# Patient Record
Sex: Male | Born: 1990
Health system: Southern US, Community
[De-identification: ages and names within clinical notes are randomized; demographics above are authoritative.]

## PROBLEM LIST (undated history)

## (undated) DIAGNOSIS — R569 Unspecified convulsions: Secondary | ICD-10-CM

## (undated) DIAGNOSIS — F419 Anxiety disorder, unspecified: Secondary | ICD-10-CM

## (undated) DIAGNOSIS — T7840XA Allergy, unspecified, initial encounter: Secondary | ICD-10-CM

## (undated) DIAGNOSIS — I82419 Acute embolism and thrombosis of unspecified femoral vein: Secondary | ICD-10-CM

## (undated) DIAGNOSIS — J45909 Unspecified asthma, uncomplicated: Secondary | ICD-10-CM

## (undated) HISTORY — DX: Allergy, unspecified, initial encounter: T78.40XA

## (undated) HISTORY — PX: MULTIPLE TOOTH EXTRACTIONS: SHX2053

## (undated) HISTORY — PX: OTHER SURGICAL HISTORY: SHX169

## (undated) HISTORY — DX: Unspecified asthma, uncomplicated: J45.909

---

## 2011-03-15 ENCOUNTER — Ambulatory Visit: Payer: Self-pay

## 2011-03-15 DIAGNOSIS — S41109A Unspecified open wound of unspecified upper arm, initial encounter: Secondary | ICD-10-CM

## 2011-03-18 ENCOUNTER — Ambulatory Visit: Payer: Self-pay

## 2011-03-18 DIAGNOSIS — S41109A Unspecified open wound of unspecified upper arm, initial encounter: Secondary | ICD-10-CM

## 2011-03-18 DIAGNOSIS — F410 Panic disorder [episodic paroxysmal anxiety] without agoraphobia: Secondary | ICD-10-CM

## 2011-05-01 ENCOUNTER — Encounter (HOSPITAL_BASED_OUTPATIENT_CLINIC_OR_DEPARTMENT_OTHER): Payer: Self-pay | Admitting: Emergency Medicine

## 2011-05-01 ENCOUNTER — Emergency Department (HOSPITAL_BASED_OUTPATIENT_CLINIC_OR_DEPARTMENT_OTHER)
Admission: EM | Admit: 2011-05-01 | Discharge: 2011-05-01 | Disposition: A | Discharge status: Home / Self Care | Payer: Self-pay | Attending: Emergency Medicine | Admitting: Emergency Medicine

## 2011-05-01 DIAGNOSIS — F411 Generalized anxiety disorder: Secondary | ICD-10-CM | POA: Insufficient documentation

## 2011-05-01 DIAGNOSIS — F419 Anxiety disorder, unspecified: Secondary | ICD-10-CM

## 2011-05-01 DIAGNOSIS — F1010 Alcohol abuse, uncomplicated: Secondary | ICD-10-CM | POA: Insufficient documentation

## 2011-05-01 DIAGNOSIS — F17200 Nicotine dependence, unspecified, uncomplicated: Secondary | ICD-10-CM | POA: Insufficient documentation

## 2011-05-01 DIAGNOSIS — R109 Unspecified abdominal pain: Secondary | ICD-10-CM | POA: Insufficient documentation

## 2011-05-01 DIAGNOSIS — K2970 Gastritis, unspecified, without bleeding: Secondary | ICD-10-CM | POA: Insufficient documentation

## 2011-05-01 HISTORY — DX: Anxiety disorder, unspecified: F41.9

## 2011-05-01 LAB — RAPID URINE DRUG SCREEN, HOSP PERFORMED
Benzodiazepines: POSITIVE — AB
Cocaine: NOT DETECTED

## 2011-05-01 LAB — COMPREHENSIVE METABOLIC PANEL
Albumin: 3.8 g/dL (ref 3.5–5.2)
BUN: 14 mg/dL (ref 6–23)
Creatinine, Ser: 0.9 mg/dL (ref 0.50–1.35)
Total Bilirubin: 0.4 mg/dL (ref 0.3–1.2)
Total Protein: 7.3 g/dL (ref 6.0–8.3)

## 2011-05-01 LAB — ETHANOL: Alcohol, Ethyl (B): <11 mg/dL (ref 0–11)

## 2011-05-01 LAB — DIFFERENTIAL
Basophils Relative: 0 % (ref 0–1)
Eosinophils Absolute: 0.6 10*3/uL (ref 0.0–0.7)
Monocytes Absolute: 1 10*3/uL (ref 0.1–1.0)
Monocytes Relative: 10 % (ref 3–12)

## 2011-05-01 LAB — CBC
HCT: 44.7 % (ref 39.0–52.0)
Hemoglobin: 15.7 g/dL (ref 13.0–17.0)
MCH: 29.1 pg (ref 26.0–34.0)
MCHC: 35.1 g/dL (ref 30.0–36.0)
MCV: 82.8 fL (ref 78.0–100.0)

## 2011-05-01 LAB — LIPASE, BLOOD: Lipase: 36 U/L (ref 11–59)

## 2011-05-01 MED ORDER — FAMOTIDINE IN NACL 20-0.9 MG/50ML-% IV SOLN
20.0000 mg | Freq: Once | INTRAVENOUS | Status: AC
  Administered 2011-05-01: 20 mg via INTRAVENOUS
  Filled 2011-05-01: qty 50

## 2011-05-01 MED ORDER — ALPRAZOLAM 1 MG PO TABS: 1.0000 mg | ORAL_TABLET | Freq: Every evening | ORAL | Status: AC | PRN

## 2011-05-01 MED ORDER — ONDANSETRON HCL 4 MG/2ML IJ SOLN
4.0000 mg | Freq: Once | INTRAMUSCULAR | Status: AC
  Administered 2011-05-01: 4 mg via INTRAVENOUS
  Filled 2011-05-01: qty 2

## 2011-05-01 MED ORDER — ALPRAZOLAM 0.5 MG PO TABS
1.0000 mg | ORAL_TABLET | Freq: Every evening | ORAL | Status: DC | PRN
  Filled 2011-05-01: qty 2

## 2011-05-01 MED ORDER — FAMOTIDINE 20 MG PO TABS: 20.0000 mg | ORAL_TABLET | Freq: Two times a day (BID) | ORAL | Status: DC

## 2011-05-01 MED ORDER — SODIUM CHLORIDE 0.9 % IV BOLUS (SEPSIS)
1000.0000 mL | Freq: Once | INTRAVENOUS | Status: AC
  Administered 2011-05-01: 1000 mL via INTRAVENOUS

## 2011-05-01 MED ORDER — ONDANSETRON HCL 4 MG PO TABS: 4.0000 mg | ORAL_TABLET | Freq: Four times a day (QID) | ORAL | Status: AC

## 2011-05-01 MED ORDER — ONDANSETRON HCL 4 MG/2ML IJ SOLN: 4.0000 mg | Freq: Once | INTRAMUSCULAR | Status: DC

## 2011-05-01 NOTE — ED Provider Notes (Signed)
History     CSN: [id number]  Arrival date & time 05/01/11  2038   First MD Initiated Contact with Patient 05/01/11 2117      Chief Complaint  Patient presents with  . Emesis  . Hiccups  . Alcohol Intoxication   Patient admits to drinking heavily over the past 4 days. States he drank a bottle of tequila 2 days ago and has been drinking, bud lites since then.  Today, he began having vomiting, hiccups, and some epigastric pain. He denies any back pain. Denies any gross hematemesis. He states he has a history of anxiety, but denies any suicidal or homicidal thoughts. He has had no dizziness or syncope. Denies any ingestion of any drugs of any sort. Patient states he stopped drinking at 12 noon today (Consider location/radiation/quality/duration/timing/severity/associated sxs/prior treatment) HPI  Past Medical History  Diagnosis Date  . Anxiety     History reviewed. No pertinent past surgical history.  No family history on file.  History  Substance Use Topics  . Smoking status: Current Everyday Smoker  . Smokeless tobacco: Not on file  . Alcohol Use: Yes      Review of Systems  All other systems reviewed and are negative.    Allergies  Review of patient's allergies indicates not on file.  Home Medications  No current outpatient prescriptions on file.  BP 136/93  Pulse 83  Temp(Src) 98.2 F (36.8 C) (Oral)  Resp 18  SpO2 100%  Physical Exam  Nursing note and vitals reviewed. Constitutional: He is oriented to person, place, and time. He appears well-developed and well-nourished.  HENT:  Head: Normocephalic and atraumatic.  Eyes: Conjunctivae and EOM are normal. Pupils are equal, round, and reactive to light.  Neck: Neck supple.  Cardiovascular: Normal rate and regular rhythm.  Exam reveals no gallop and no friction rub.   No murmur heard. Pulmonary/Chest: Breath sounds normal. He has no wheezes. He has no rales. He exhibits no tenderness.  Abdominal: Soft.  Bowel sounds are normal. He exhibits no distension. There is no tenderness. There is no rebound and no guarding.       Minimal tenderness to the epigastric region. No rebound, rigidity or guarding.  Musculoskeletal: Normal range of motion.  Neurological: He is alert and oriented to person, place, and time. No cranial nerve deficit. Coordination normal.  Skin: Skin is warm and dry. No rash noted.  Psychiatric: He has a normal mood and affect.    ED Course  Procedures (including critical care time)   Labs Reviewed  CBC  DIFFERENTIAL  COMPREHENSIVE METABOLIC PANEL  LIPASE, BLOOD  ETHANOL  URINE RAPID DRUG SCREEN (HOSP PERFORMED)   No results found.   No diagnosis found.    MDM  Patient is seen and examined, initial history and physical is completed. Evaluation initiated      Results for orders placed during the hospital encounter of 05/01/11  CBC      Component Value Range   WBC 10.0  4.0 - 10.5 (K/uL)   RBC 5.40  4.22 - 5.81 (MIL/uL)   Hemoglobin 15.7  13.0 - 17.0 (g/dL)   HCT 91.4  78.2 - 95.6 (%)   MCV 82.8  78.0 - 100.0 (fL)   MCH 29.1  26.0 - 34.0 (pg)   MCHC 35.1  30.0 - 36.0 (g/dL)   RDW 21.3  08.6 - 57.8 (%)   Platelets 351  150 - 400 (K/uL)  DIFFERENTIAL      Component Value Range  Neutrophils Relative 61  43 - 77 (%)   Neutro Abs 6.2  1.7 - 7.7 (K/uL)   Lymphocytes Relative 22  12 - 46 (%)   Lymphs Abs 2.3  0.7 - 4.0 (K/uL)   Monocytes Relative 10  3 - 12 (%)   Monocytes Absolute 1.0  0.1 - 1.0 (K/uL)   Eosinophils Relative 6 (*) 0 - 5 (%)   Eosinophils Absolute 0.6  0.0 - 0.7 (K/uL)   Basophils Relative 0  0 - 1 (%)   Basophils Absolute 0.0  0.0 - 0.1 (K/uL)  COMPREHENSIVE METABOLIC PANEL      Component Value Range   Sodium 141  135 - 145 (mEq/L)   Potassium 3.5  3.5 - 5.1 (mEq/L)   Chloride 100  96 - 112 (mEq/L)   CO2 33 (*) 19 - 32 (mEq/L)   Glucose, Bld 88  70 - 99 (mg/dL)   BUN 14  6 - 23 (mg/dL)   Creatinine, Ser 1.61  0.50 - 1.35  (mg/dL)   Calcium 9.2  8.4 - 09.6 (mg/dL)   Total Protein 7.3  6.0 - 8.3 (g/dL)   Albumin 3.8  3.5 - 5.2 (g/dL)   AST 19  0 - 37 (U/L)   ALT 14  0 - 53 (U/L)   Alkaline Phosphatase 72  39 - 117 (U/L)   Total Bilirubin 0.4  0.3 - 1.2 (mg/dL)   GFR calc non Af Amer >90  >90 (mL/min)   GFR calc Af Amer >90  >90 (mL/min)  LIPASE, BLOOD      Component Value Range   Lipase 36  11 - 59 (U/L)  ETHANOL      Component Value Range   Alcohol, Ethyl (B) <11  0 - 11 (mg/dL)  URINE RAPID DRUG SCREEN (HOSP PERFORMED)      Component Value Range   Opiates NONE DETECTED  NONE DETECTED    Cocaine NONE DETECTED  NONE DETECTED    Benzodiazepines PENDING  NONE DETECTED    Amphetamines NONE DETECTED  NONE DETECTED    Tetrahydrocannabinol NONE DETECTED  NONE DETECTED    Barbiturates NONE DETECTED  NONE DETECTED    No results found.  Feeling much better. Her lab studies reviewed. Stable for discharge. Patient is requesting prescriptions for Xanax, which she's been taking for long periods of time for chronic anxiety. Patient states he will see Dr. Excell Seltzer within one week for followup  Theron Arista A. Patrica Duel, MD 05/01/11 2314

## 2011-05-01 NOTE — ED Notes (Signed)
Pt c/o nausea, vomiting and hiccups. Pt states he as been drinking large amount of etoh x 4days. Last drink was at 2pm today.

## 2011-11-29 ENCOUNTER — Ambulatory Visit: Payer: Self-pay | Admitting: Family Medicine

## 2011-11-29 ENCOUNTER — Other Ambulatory Visit: Payer: Self-pay | Admitting: Family Medicine

## 2011-11-29 VITALS — BP 140/102 | HR 80 | Temp 98.1°F | Resp 16 | Ht 68.25 in | Wt 202.6 lb

## 2011-11-29 DIAGNOSIS — IMO0002 Reserved for concepts with insufficient information to code with codable children: Secondary | ICD-10-CM

## 2011-11-29 DIAGNOSIS — L738 Other specified follicular disorders: Secondary | ICD-10-CM

## 2011-11-29 DIAGNOSIS — L02419 Cutaneous abscess of limb, unspecified: Secondary | ICD-10-CM

## 2011-11-29 DIAGNOSIS — R05 Cough: Secondary | ICD-10-CM

## 2011-11-29 DIAGNOSIS — R059 Cough, unspecified: Secondary | ICD-10-CM

## 2011-11-29 DIAGNOSIS — L739 Follicular disorder, unspecified: Secondary | ICD-10-CM

## 2011-11-29 DIAGNOSIS — J069 Acute upper respiratory infection, unspecified: Secondary | ICD-10-CM

## 2011-11-29 MED ORDER — DOXYCYCLINE HYCLATE 100 MG PO TABS: 100.0000 mg | ORAL_TABLET | Freq: Two times a day (BID) | ORAL | Status: AC

## 2011-11-29 MED ORDER — DOXYCYCLINE HYCLATE 100 MG PO TABS: 100.0000 mg | ORAL_TABLET | Freq: Two times a day (BID) | ORAL | Status: DC

## 2011-11-29 NOTE — Patient Instructions (Addendum)
Wash once or twice daily.  Doxycycline one twice daily. This should help both the scan and the respiratory infection.  Zyrtec one daily (generic: cetirizine)

## 2011-11-29 NOTE — Progress Notes (Signed)
Subjective: Patient is here for several things. He has multiple abscesses on his left axilla, back, and buttocks. The place is under his arm drained on their own. He's been having trouble for about a week.  He also has a cold, nasal congestion, cough.  The rash itches a lot.  Objective Solving axillary abscesses. Also has multiple follicular and tiny abscess areas on his buttocks andhis back. The redness looking one was on his low back, and this was cultured.  His TMs are normal. Throat mildly erythematous. Neck supple without significant nodes. Chest is clear to auscultation.  Assessment: Axillary abscesses Folliculitis URI  Plan: Doxycycline  RTC if worse. I declined to give him a prescription for Xanax. He smokes pot twice a day and I told him that's probably aggravating his anxiety. Urged him to get off of it.

## 2011-12-04 LAB — WOUND CULTURE: Gram Stain: NONE SEEN

## 2011-12-05 ENCOUNTER — Encounter: Payer: Self-pay | Admitting: Family Medicine

## 2012-01-04 ENCOUNTER — Ambulatory Visit: Payer: Self-pay | Admitting: Family Medicine

## 2012-01-04 VITALS — BP 117/80 | HR 71 | Temp 97.9°F | Resp 16 | Ht 69.0 in | Wt 208.0 lb

## 2012-01-04 DIAGNOSIS — Z113 Encounter for screening for infections with a predominantly sexual mode of transmission: Secondary | ICD-10-CM

## 2012-01-04 DIAGNOSIS — L02419 Cutaneous abscess of limb, unspecified: Secondary | ICD-10-CM

## 2012-01-04 DIAGNOSIS — IMO0002 Reserved for concepts with insufficient information to code with codable children: Secondary | ICD-10-CM

## 2012-01-04 DIAGNOSIS — L739 Follicular disorder, unspecified: Secondary | ICD-10-CM

## 2012-01-04 DIAGNOSIS — L299 Pruritus, unspecified: Secondary | ICD-10-CM

## 2012-01-04 DIAGNOSIS — L738 Other specified follicular disorders: Secondary | ICD-10-CM

## 2012-01-04 MED ORDER — HYDROXYZINE PAMOATE 25 MG PO CAPS: 25.0000 mg | ORAL_CAPSULE | Freq: Three times a day (TID) | ORAL | Status: DC | PRN

## 2012-01-04 MED ORDER — DOXYCYCLINE HYCLATE 100 MG PO TABS: 100.0000 mg | ORAL_TABLET | Freq: Two times a day (BID) | ORAL | Status: AC

## 2012-01-04 NOTE — Progress Notes (Signed)
Subjective: Patient is here for recheck with going to the skin lesions we treated previously. Most of them cleared up a great deal, but he still has a place on the buttocks. These have recurred little worse. His other problem is it just itches all of her. This been going on for long time. 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  Objective: Folliculitis on shoulders and back and axilla is much better. He has a small mobile abscesses on his buttocks. These are not draining. Most are crusted.  Assessment: Nonspecific pruritus Cutaneous abscesses on buttocks  Plan: Doxycycline 100 mg twice a day for one month Hydroxyzine 25 mg 3 times a day when necessary itching Check blood chemistries panel

## 2012-01-04 NOTE — Addendum Note (Signed)
Addended by: Gloriann Loan L on: 01/04/2012 05:00 PM   Modules accepted: Orders

## 2012-01-04 NOTE — Patient Instructions (Addendum)
Take the doxycycline for a full month for the infection.  Use the hydroxyzine one pill 3 times daily only when needed for bad itching. It may make your little bit drowsy.  Return if problems persist.

## 2012-01-05 LAB — COMPREHENSIVE METABOLIC PANEL
Alkaline Phosphatase: 69 U/L (ref 39–117)
Creat: 0.9 mg/dL (ref 0.50–1.35)
Glucose, Bld: 85 mg/dL (ref 70–99)
Sodium: 140 mEq/L (ref 135–145)
Total Bilirubin: 0.4 mg/dL (ref 0.3–1.2)
Total Protein: 7.8 g/dL (ref 6.0–8.3)

## 2012-01-05 LAB — HIV ANTIBODY (ROUTINE TESTING W REFLEX): HIV: NONREACTIVE

## 2013-10-12 ENCOUNTER — Ambulatory Visit: Payer: No Typology Code available for payment source

## 2013-10-12 ENCOUNTER — Ambulatory Visit (INDEPENDENT_AMBULATORY_CARE_PROVIDER_SITE_OTHER): Payer: No Typology Code available for payment source | Admitting: Emergency Medicine

## 2013-10-12 VITALS — BP 124/84 | HR 76 | Temp 97.9°F | Resp 16 | Ht 68.75 in | Wt 200.8 lb

## 2013-10-12 DIAGNOSIS — M545 Low back pain, unspecified: Secondary | ICD-10-CM

## 2013-10-12 MED ORDER — NAPROXEN SODIUM 550 MG PO TABS: 550.0000 mg | ORAL_TABLET | Freq: Two times a day (BID) | ORAL | Status: DC

## 2013-10-12 MED ORDER — TRAMADOL HCL 50 MG PO TABS: 50.0000 mg | ORAL_TABLET | Freq: Four times a day (QID) | ORAL | Status: DC | PRN

## 2013-10-12 MED ORDER — CYCLOBENZAPRINE HCL 10 MG PO TABS: 10.0000 mg | ORAL_TABLET | Freq: Three times a day (TID) | ORAL | Status: DC | PRN

## 2013-10-12 NOTE — Patient Instructions (Signed)

## 2013-10-12 NOTE — Progress Notes (Signed)
[facility] [address], [place] [address] [phone]  Date:  10/12/2013   Name:  Brandon Moyer   DOB:  05/22/1990   MRN:  [id number]  PCP:  Tally DueGUEST, CHRIS WARREN, MD    Chief Complaint: Back Pain   History of Present Illness:  Brandon Moyer is a 23 y.o. very pleasant male patient who presents with the following:  No history of injury.  Has over one year history of pain in low back and between shoulder blades.  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  Non radiating. No neuro symptoms.  No improvement with over the counter medications or other home remedies. Denies other complaint or health concern today.   Patient Active Problem List   Diagnosis Date Noted  . Folliculitis 01/04/2012    Past Medical History  Diagnosis Date  . Anxiety   . Allergy   . Asthma     History reviewed. No pertinent past surgical history.  History  Substance Use Topics  . Smoking status: Current Every Day Smoker -- 0.80 packs/day  . Smokeless tobacco: Not on file  . Alcohol Use: No    Family History  Problem Relation Age of Onset  . Diabetes Mother     Allergies  Allergen Reactions  . Penicillins Hives    Medication list has been reviewed and updated.  Current Outpatient Prescriptions on File Prior to Visit  Medication Sig Dispense Refill  . ALPRAZolam (XANAX) 1 MG tablet Take 1 mg by mouth 2 (two) times daily as needed. For anxiety      . citalopram (CELEXA) 20 MG tablet Take 20 mg by mouth daily.      . hydrOXYzine (VISTARIL) 25 MG capsule Take 1 capsule (25 mg total) by mouth 3 (three) times daily as needed for itching.  30 capsule  2   No current facility-administered medications on file prior to visit.    Review of Systems:  As per HPI, otherwise negative.    Physical Examination: Filed Vitals:   10/12/13 1236  BP: 124/84  Pulse: 76  Temp: 97.9 F (36.6 C)  Resp: 16   Filed Vitals:   10/12/13 1236  Height: 5' 8.75"  (1.746 m)  Weight: 200 lb 12.8 oz (91.082 kg)   Body mass index is 29.88 kg/(m^2). Ideal Body Weight: Weight in (lb) to have BMI = 25: 167.7   GEN: WDWN, NAD, Non-toxic, Alert & Oriented x 3 HEENT: Atraumatic, Normocephalic.  Ears and Nose: No external deformity. EXTR: No clubbing/cyanosis/edema NEURO: Normal gait.  PSYCH: Normally interactive. Conversant. Not depressed or anxious appearing.  Calm demeanor.  Back:  Generalize tenderness in paraspinous muscles  Assessment and Plan: Back strain Anaprox Flexeril Ultram  Signed,  Phillips OdorJeffery Victoriya Pol, MD   [facility] reading (PRIMARY) by  Dr. Dareen PianoAnderson.  No osseous injury.

## 2014-04-06 ENCOUNTER — Ambulatory Visit (INDEPENDENT_AMBULATORY_CARE_PROVIDER_SITE_OTHER): Payer: No Typology Code available for payment source | Admitting: Physician Assistant

## 2014-04-06 VITALS — BP 132/92 | HR 84 | Temp 98.0°F | Resp 20 | Ht 69.0 in | Wt 185.0 lb

## 2014-04-06 DIAGNOSIS — F411 Generalized anxiety disorder: Secondary | ICD-10-CM

## 2014-04-06 DIAGNOSIS — M62830 Muscle spasm of back: Secondary | ICD-10-CM

## 2014-04-06 MED ORDER — CYCLOBENZAPRINE HCL 10 MG PO TABS: 10.0000 mg | ORAL_TABLET | Freq: Three times a day (TID) | ORAL | Status: DC | PRN

## 2014-04-06 MED ORDER — PAROXETINE HCL 20 MG PO TABS: 20.0000 mg | ORAL_TABLET | Freq: Every day | ORAL | Status: DC

## 2014-04-06 MED ORDER — ALPRAZOLAM 1 MG PO TABS: 1.0000 mg | ORAL_TABLET | Freq: Two times a day (BID) | ORAL | Status: DC | PRN

## 2014-04-06 NOTE — Patient Instructions (Signed)
Your back pain is due to some muscle strain and spasm along your spinal muscles. Please take the flexeril as needed up to three times a day for the spasm.  Regular massages and yoga will help with your posture and the back discomfort.  Be sure to maintain good posture while working. For your anxiety, we'll restart an SSRI today. Remember, this takes several weeks to really take effect. Please take 20 mg once daily of the paxil. I've refilled your xanax with 30 pills. Please take these up to twice daily as needed for anxiety. This will help to get you through the period before your ssri starts helping. Please think some more about counseling as this may help quite a bit for you.  I'd like to see you back in about 2 months to see how these medications are doing and for possible refills.

## 2014-04-06 NOTE — Progress Notes (Signed)
Subjective:    Patient ID: Brandon FreestoneSalvador Moyer, male    DOB: 12/03/1990, 23 y.o.   MRN: [id number]  PCP: Tally DueGUEST, CHRIS WARREN, MD  Chief Complaint  Patient presents with  . Back Pain    upper to mid back pain with NKI  . Medication Refill    discontinued taking meds per pt, now feels he needs to be back on xanax   Patient Active Problem List   Diagnosis Date Noted  . Anxiety state 04/06/2014   Prior to Admission medications   Medication Sig Start Date End Date Taking? Authorizing Provider  ALPRAZolam Prudy Feeler(XANAX) 1 MG tablet Take 1 tablet (1 mg total) by mouth 2 (two) times daily as needed. For anxiety 04/06/14   Raelyn Ensignodd Darrow Barreiro, PA  cyclobenzaprine (FLEXERIL) 10 MG tablet Take 1 tablet (10 mg total) by mouth 3 (three) times daily as needed for muscle spasms. 04/06/14   Raelyn Ensignodd Nimrod Wendt, PA  PARoxetine (PAXIL) 20 MG tablet Take 1 tablet (20 mg total) by mouth daily. 04/06/14   Raelyn Ensignodd Denielle Bayard, PA   Medications, allergies, past medical history, surgical history, family history, social history and problem list reviewed and updated.  HPI  [age] yom with pmh poorly controlled anxiety presents today for back pain and refill of xanax.   Back pain - Has been present for several yrs. Was seen here by Dr Dareen PianoAnderson in 7/15 and diagnosed with lumbar strain/spasm and tx with ultram, flexeril, and naproxen. Lumbar xr negative at that time. Today he presents stating those meds help but the back pain has been present again for past few months. This time is located more along upper and mid back. Denies any trauma. 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 Denies any tingling/numbess/HA/chest pain/sob.   Anxiety - [place] a [place]. Has been on xanax intermittently for many yrs. Has taken anywhere fro tid to nightly. He was put on celexa several yrs ago which he thinks helped but he's not sure. Stopped taking it yr or so ago, unsure why. He tried  counseling several yrs ago which he feels helped. He ran out of his xanax few months ago. He is very anxious thinking about his work as he will be travelling to different tattoo parlors throughout the country over the next year. His anxiety affects him every day. He has trouble sleeping as he wakes up several times nightly feeling anxious. Tries breathing exercises which help mildly. No SI/HI.   Review of Systems No CP, SOB, fever, chills.     Objective:   Physical Exam  Constitutional: He is oriented to person, place, and time. He appears well-developed and well-nourished.  Non-toxic appearance. He does not have a sickly appearance. He does not appear ill. No distress.  BP 132/92 mmHg  Pulse 84  Temp(Src) 98 F (36.7 C) (Oral)  Resp 20  Ht 5\' 9"  (1.753 m)  Wt 185 lb (83.915 kg)  BMI 27.31 kg/m2  SpO2 97%   Musculoskeletal:       Thoracic back: He exhibits tenderness and spasm. He exhibits normal range of motion and no bony tenderness.       Lumbar back: He exhibits tenderness. He exhibits normal range of motion, no bony tenderness and no spasm.  TTP along spine from thoracic to lumbar back. Normal ROM. Normal sensation. Normal strength all 4 extremities. Spasm noted both right and left borders thoracic spine. No lumbar spasm.   Neurological: He is alert and  oriented to person, place, and time.  Psychiatric: He has a normal mood and affect. His speech is normal.      Assessment & Plan:   23 yom with pmh poorly controlled anxiety presents today for back pain and refill of xanax.   Anxiety state - Plan: PARoxetine (PAXIL) 20 MG tablet, ALPRAZolam (XANAX) 1 MG tablet --started paxil 20 mg qd, 3 months supply --instructed about sedating effects and possible weight gain --pt instructed that will take several wks for affect --pt encouraged to rtc in 2 months to assess efficacy. He may be travelling at that time. Encouraged to f/u with pcp wherever he is or to call our clinic as  needed --refilled xanax #30 bid prn for use as paxil takes effect --encouraged counseling  Muscle spasm of back - Plan: cyclobenzaprine (FLEXERIL) 10 MG tablet --flexeril tid prn --ibuprofen as needed --encouraged proper posture at work --encouraged massage and yoga to help with spasm and with core strengthening  Donnajean Lopesodd M. Vegas Coffin, PA-C Physician Assistant-Certified [facility]  04/06/2014 6:08 PM

## 2014-08-30 ENCOUNTER — Encounter (HOSPITAL_COMMUNITY): Payer: Self-pay

## 2014-08-30 ENCOUNTER — Observation Stay (HOSPITAL_COMMUNITY)
Admission: EM | Admit: 2014-08-30 | Discharge: 2014-08-31 | Disposition: A | Discharge status: Home / Self Care | Payer: 59 | Attending: General Surgery | Admitting: General Surgery

## 2014-08-30 ENCOUNTER — Emergency Department (HOSPITAL_COMMUNITY): Payer: 59

## 2014-08-30 DIAGNOSIS — S272XXA Traumatic hemopneumothorax, initial encounter: Secondary | ICD-10-CM

## 2014-08-30 DIAGNOSIS — Z833 Family history of diabetes mellitus: Secondary | ICD-10-CM | POA: Insufficient documentation

## 2014-08-30 DIAGNOSIS — F1210 Cannabis abuse, uncomplicated: Secondary | ICD-10-CM | POA: Diagnosis not present

## 2014-08-30 DIAGNOSIS — J45909 Unspecified asthma, uncomplicated: Secondary | ICD-10-CM | POA: Insufficient documentation

## 2014-08-30 DIAGNOSIS — F411 Generalized anxiety disorder: Secondary | ICD-10-CM | POA: Insufficient documentation

## 2014-08-30 DIAGNOSIS — M25552 Pain in left hip: Secondary | ICD-10-CM | POA: Diagnosis present

## 2014-08-30 DIAGNOSIS — S301XXA Contusion of abdominal wall, initial encounter: Secondary | ICD-10-CM | POA: Insufficient documentation

## 2014-08-30 DIAGNOSIS — F17210 Nicotine dependence, cigarettes, uncomplicated: Secondary | ICD-10-CM | POA: Diagnosis not present

## 2014-08-30 DIAGNOSIS — R52 Pain, unspecified: Secondary | ICD-10-CM

## 2014-08-30 DIAGNOSIS — S270XXA Traumatic pneumothorax, initial encounter: Principal | ICD-10-CM | POA: Insufficient documentation

## 2014-08-30 DIAGNOSIS — Z880 Allergy status to penicillin: Secondary | ICD-10-CM | POA: Insufficient documentation

## 2014-08-30 LAB — CBC WITH DIFFERENTIAL/PLATELET
BASOS ABS: 0 10*3/uL (ref 0.0–0.1)
Basophils Absolute: 0 10*3/uL (ref 0.0–0.1)
Basophils Relative: 0 % (ref 0–1)
Basophils Relative: 0 % (ref 0–1)
Eosinophils Absolute: 0.2 10*3/uL (ref 0.0–0.7)
Eosinophils Absolute: 0.3 10*3/uL (ref 0.0–0.7)
Eosinophils Relative: 1 % (ref 0–5)
Eosinophils Relative: 3 % (ref 0–5)
HCT: 50.3 % (ref 39.0–52.0)
HCT: 46.6 % (ref 39.0–52.0)
HEMOGLOBIN: 16.8 g/dL (ref 13.0–17.0)
Hemoglobin: 17.8 g/dL — ABNORMAL HIGH (ref 13.0–17.0)
LYMPHS ABS: 2.3 10*3/uL (ref 0.7–4.0)
LYMPHS ABS: 2.1 10*3/uL (ref 0.7–4.0)
LYMPHS PCT: 18 % (ref 12–46)
Lymphocytes Relative: 14 % (ref 12–46)
MCH: 30.2 pg (ref 26.0–34.0)
MCH: 30.6 pg (ref 26.0–34.0)
MCHC: 35.4 g/dL (ref 30.0–36.0)
MCHC: 36.1 g/dL — ABNORMAL HIGH (ref 30.0–36.0)
MCV: 85.4 fL (ref 78.0–100.0)
MCV: 84.9 fL (ref 78.0–100.0)
MONOS PCT: 7 % (ref 3–12)
MONOS PCT: 7 % (ref 3–12)
Monocytes Absolute: 1.2 10*3/uL — ABNORMAL HIGH (ref 0.1–1.0)
Monocytes Absolute: 0.8 10*3/uL (ref 0.1–1.0)
NEUTROS ABS: 8.1 10*3/uL — AB (ref 1.7–7.7)
Neutro Abs: 13 10*3/uL — ABNORMAL HIGH (ref 1.7–7.7)
Neutrophils Relative %: 78 % — ABNORMAL HIGH (ref 43–77)
Neutrophils Relative %: 72 % (ref 43–77)
PLATELETS: 327 10*3/uL (ref 150–400)
Platelets: 303 10*3/uL (ref 150–400)
RBC: 5.89 MIL/uL — AB (ref 4.22–5.81)
RBC: 5.49 MIL/uL (ref 4.22–5.81)
RDW: 12.7 % (ref 11.5–15.5)
RDW: 12.7 % (ref 11.5–15.5)
WBC: 16.8 10*3/uL — ABNORMAL HIGH (ref 4.0–10.5)
WBC: 11.3 10*3/uL — ABNORMAL HIGH (ref 4.0–10.5)

## 2014-08-30 LAB — URINALYSIS, ROUTINE W REFLEX MICROSCOPIC
Bilirubin Urine: NEGATIVE
GLUCOSE, UA: NEGATIVE mg/dL
Ketones, ur: NEGATIVE mg/dL
Leukocytes, UA: NEGATIVE
Nitrite: NEGATIVE
PH: 6 (ref 5.0–8.0)
PROTEIN: NEGATIVE mg/dL
Specific Gravity, Urine: 1.007 (ref 1.005–1.030)
Urobilinogen, UA: 0.2 mg/dL (ref 0.0–1.0)

## 2014-08-30 LAB — I-STAT CHEM 8, ED
BUN: 7 mg/dL (ref 6–20)
Calcium, Ion: 1.09 mmol/L — ABNORMAL LOW (ref 1.12–1.23)
Chloride: 102 mmol/L (ref 101–111)
Creatinine, Ser: 0.9 mg/dL (ref 0.61–1.24)
Glucose, Bld: 100 mg/dL — ABNORMAL HIGH (ref 65–99)
HEMATOCRIT: 56 % — AB (ref 39.0–52.0)
Hemoglobin: 19 g/dL — ABNORMAL HIGH (ref 13.0–17.0)
POTASSIUM: 4.5 mmol/L (ref 3.5–5.1)
Sodium: 135 mmol/L (ref 135–145)
TCO2: 21 mmol/L (ref 0–100)

## 2014-08-30 LAB — RAPID URINE DRUG SCREEN, HOSP PERFORMED
AMPHETAMINES: NOT DETECTED
BENZODIAZEPINES: POSITIVE — AB
Barbiturates: NOT DETECTED
COCAINE: POSITIVE — AB
OPIATES: NOT DETECTED
TETRAHYDROCANNABINOL: POSITIVE — AB

## 2014-08-30 LAB — URINE MICROSCOPIC-ADD ON

## 2014-08-30 LAB — ETHANOL: Alcohol, Ethyl (B): <5 mg/dL (ref <5)

## 2014-08-30 MED ORDER — TRAMADOL HCL 50 MG PO TABS
50.0000 mg | ORAL_TABLET | Freq: Four times a day (QID) | ORAL | Status: DC | PRN
Start: 2014-08-30 — End: 2014-08-31
  Administered 2014-08-30: 100 mg via ORAL
  Filled 2014-08-30 (×2): qty 2

## 2014-08-30 MED ORDER — HYDROCODONE-ACETAMINOPHEN 5-325 MG PO TABS
1.0000 | ORAL_TABLET | Freq: Four times a day (QID) | ORAL | Status: DC | PRN
  Administered 2014-08-30: 2 via ORAL
  Filled 2014-08-30 (×3): qty 2

## 2014-08-30 MED ORDER — ONDANSETRON HCL 4 MG/2ML IJ SOLN: 4.0000 mg | INTRAMUSCULAR | Status: DC | PRN

## 2014-08-30 MED ORDER — LORAZEPAM 2 MG/ML IJ SOLN: 1.0000 mg | Freq: Four times a day (QID) | INTRAMUSCULAR | Status: DC | PRN

## 2014-08-30 MED ORDER — SODIUM CHLORIDE 0.9 % IV SOLN
INTRAVENOUS | Status: DC
  Administered 2014-08-30: 08:00:00 via INTRAVENOUS

## 2014-08-30 MED ORDER — PAROXETINE HCL 20 MG PO TABS
20.0000 mg | ORAL_TABLET | Freq: Every day | ORAL | Status: DC
  Administered 2014-08-30 – 2014-08-31 (×2): 20 mg via ORAL
  Filled 2014-08-30 (×2): qty 1

## 2014-08-30 MED ORDER — SODIUM CHLORIDE 0.9 % IV SOLN
INTRAVENOUS | Status: DC
  Filled 2014-08-30: qty 1000

## 2014-08-30 MED ORDER — MORPHINE SULFATE 2 MG/ML IJ SOLN: 2.0000 mg | INTRAMUSCULAR | Status: DC | PRN

## 2014-08-30 MED ORDER — IOHEXOL 300 MG/ML  SOLN
100.0000 mL | Freq: Once | INTRAMUSCULAR | Status: AC | PRN
  Administered 2014-08-30: 100 mL via INTRAVENOUS

## 2014-08-30 MED ORDER — CYCLOBENZAPRINE HCL 10 MG PO TABS
10.0000 mg | ORAL_TABLET | Freq: Three times a day (TID) | ORAL | Status: DC | PRN
  Administered 2014-08-30: 10 mg via ORAL
  Filled 2014-08-30: qty 1

## 2014-08-30 MED ORDER — ALPRAZOLAM 0.5 MG PO TABS
1.0000 mg | ORAL_TABLET | Freq: Two times a day (BID) | ORAL | Status: DC | PRN
  Administered 2014-08-30 (×2): 1 mg via ORAL
  Filled 2014-08-30: qty 4
  Filled 2014-08-30: qty 2
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
## 2014-08-30 NOTE — Progress Notes (Signed)
Patient asked to return to room after going into another patients room.  Patient very anxious; walking around the department repeatedly. He stated i am trying to be nice to everyone. Patient mentioned he did not care if we called his MD he was leaving the department to smoke.  He does not want a nicotine patch at this time. Patient walked off unit at 415pm.

## 2014-08-30 NOTE — ED Notes (Signed)
Attempted report 

## 2014-08-30 NOTE — ED Notes (Signed)
[employer] at bedside 
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
## 2014-08-30 NOTE — ED Notes (Signed)
Pt in radiology 

## 2014-08-30 NOTE — ED Notes (Signed)
Casimiro NeedleMichael, Trauma PA at bedside.

## 2014-08-31 ENCOUNTER — Observation Stay (HOSPITAL_COMMUNITY): Payer: 59

## 2014-08-31 DIAGNOSIS — S270XXA Traumatic pneumothorax, initial encounter: Secondary | ICD-10-CM | POA: Diagnosis not present

## 2014-08-31 MED ORDER — MORPHINE SULFATE 2 MG/ML IJ SOLN: 2.0000 mg | INTRAMUSCULAR | Status: DC | PRN

## 2014-08-31 MED ORDER — TRAMADOL HCL 50 MG PO TABS: 50.0000 mg | ORAL_TABLET | Freq: Four times a day (QID) | ORAL | Status: DC | PRN

## 2014-08-31 MED ORDER — HYDROCODONE-ACETAMINOPHEN 5-325 MG PO TABS
1.0000 | ORAL_TABLET | Freq: Once | ORAL | Status: AC
  Administered 2014-08-31: 1 via ORAL

## 2014-08-31 NOTE — Progress Notes (Signed)
0145 Patient c/o pain stating that pain medication is not holding him long enough. Dr. Andrey CampanileWilson notified and orders received. Went to tell the patient had left the floor with a friend. 0230 Patient had not gotten back went to look for the patient unable to find notified security that patient had not come back and I was unable to find him. 0330 [facility] call and stated patient was on their floor. Patient was assisted back to [id number] via nurse. Patient stated he was just smoking out side. Patient very hyper and pupils pinpoint. Vicodin one tab given at 0338. Patient was given some food to eat. 0400 Patient asleep and is resting at the present.
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
## 2014-08-31 NOTE — Progress Notes (Signed)
Discussed discharge summary with patient. Reviewed all medications with patient. Patient stated "Tramadol doesn't work for him" and refused Rx. Patient ready for discharge.

## 2014-09-30 ENCOUNTER — Ambulatory Visit (INDEPENDENT_AMBULATORY_CARE_PROVIDER_SITE_OTHER): Payer: No Typology Code available for payment source | Admitting: Emergency Medicine

## 2014-09-30 VITALS — BP 124/80 | HR 85 | Temp 98.1°F | Resp 17 | Ht 69.0 in | Wt 183.2 lb

## 2014-09-30 DIAGNOSIS — J209 Acute bronchitis, unspecified: Secondary | ICD-10-CM | POA: Diagnosis not present

## 2014-09-30 DIAGNOSIS — J0140 Acute pansinusitis, unspecified: Secondary | ICD-10-CM

## 2014-09-30 MED ORDER — PSEUDOEPHEDRINE-GUAIFENESIN ER 60-600 MG PO TB12: 1.0000 | ORAL_TABLET | Freq: Two times a day (BID) | ORAL | Status: AC

## 2014-09-30 MED ORDER — CEFPROZIL 500 MG PO TABS: 500.0000 mg | ORAL_TABLET | Freq: Two times a day (BID) | ORAL | Status: AC

## 2014-09-30 MED ORDER — HYDROCOD POLST-CPM POLST ER 10-8 MG/5ML PO SUER: 5.0000 mL | Freq: Two times a day (BID) | ORAL | Status: DC
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
## 2014-09-30 NOTE — Patient Instructions (Signed)

## 2015-02-02 ENCOUNTER — Emergency Department (HOSPITAL_COMMUNITY)
Admission: EM | Admit: 2015-02-02 | Discharge: 2015-02-02 | Disposition: A | Discharge status: Home / Self Care | Payer: 59 | Attending: Emergency Medicine | Admitting: Emergency Medicine

## 2015-02-02 ENCOUNTER — Encounter (HOSPITAL_COMMUNITY): Payer: Self-pay | Admitting: Emergency Medicine

## 2015-02-02 DIAGNOSIS — F1210 Cannabis abuse, uncomplicated: Secondary | ICD-10-CM | POA: Insufficient documentation

## 2015-02-02 DIAGNOSIS — Z880 Allergy status to penicillin: Secondary | ICD-10-CM | POA: Insufficient documentation

## 2015-02-02 DIAGNOSIS — Z86718 Personal history of other venous thrombosis and embolism: Secondary | ICD-10-CM | POA: Insufficient documentation

## 2015-02-02 DIAGNOSIS — Z79899 Other long term (current) drug therapy: Secondary | ICD-10-CM | POA: Diagnosis not present

## 2015-02-02 DIAGNOSIS — F1310 Sedative, hypnotic or anxiolytic abuse, uncomplicated: Secondary | ICD-10-CM | POA: Diagnosis not present

## 2015-02-02 DIAGNOSIS — Z720 Tobacco use: Secondary | ICD-10-CM | POA: Insufficient documentation

## 2015-02-02 DIAGNOSIS — R569 Unspecified convulsions: Secondary | ICD-10-CM | POA: Diagnosis present

## 2015-02-02 DIAGNOSIS — J45909 Unspecified asthma, uncomplicated: Secondary | ICD-10-CM | POA: Diagnosis not present

## 2015-02-02 DIAGNOSIS — F1510 Other stimulant abuse, uncomplicated: Secondary | ICD-10-CM | POA: Insufficient documentation

## 2015-02-02 HISTORY — DX: Acute embolism and thrombosis of unspecified femoral vein: I82.419

## 2015-02-02 HISTORY — DX: Unspecified convulsions: R56.9

## 2015-02-02 LAB — BASIC METABOLIC PANEL
Anion gap: 5 (ref 5–15)
BUN: 8 mg/dL (ref 6–20)
CHLORIDE: 101 mmol/L (ref 101–111)
CO2: 29 mmol/L (ref 22–32)
CREATININE: 0.93 mg/dL (ref 0.61–1.24)
Calcium: 8.9 mg/dL (ref 8.9–10.3)
GFR calc non Af Amer: >60 mL/min (ref >60)
Glucose, Bld: 88 mg/dL (ref 65–99)
Potassium: 6.6 mmol/L (ref 3.5–5.1)
Sodium: 135 mmol/L (ref 135–145)

## 2015-02-02 LAB — URINALYSIS, ROUTINE W REFLEX MICROSCOPIC
BILIRUBIN URINE: NEGATIVE
GLUCOSE, UA: NEGATIVE mg/dL
HGB URINE DIPSTICK: NEGATIVE
Ketones, ur: NEGATIVE mg/dL
Nitrite: NEGATIVE
PROTEIN: NEGATIVE mg/dL
Specific Gravity, Urine: 1.008 (ref 1.005–1.030)
UROBILINOGEN UA: 1 mg/dL (ref 0.0–1.0)
pH: 7.5 (ref 5.0–8.0)

## 2015-02-02 LAB — CBC WITH DIFFERENTIAL/PLATELET
Basophils Absolute: 0 10*3/uL (ref 0.0–0.1)
Basophils Relative: 0 %
EOS ABS: 0.3 10*3/uL (ref 0.0–0.7)
Eosinophils Relative: 3 %
HEMATOCRIT: 46.7 % (ref 39.0–52.0)
HEMOGLOBIN: 16.6 g/dL (ref 13.0–17.0)
LYMPHS ABS: 2.3 10*3/uL (ref 0.7–4.0)
Lymphocytes Relative: 21 %
MCH: 30.2 pg (ref 26.0–34.0)
MCHC: 35.5 g/dL (ref 30.0–36.0)
MCV: 85.1 fL (ref 78.0–100.0)
Monocytes Absolute: 1 10*3/uL (ref 0.1–1.0)
Monocytes Relative: 9 %
NEUTROS ABS: 7.4 10*3/uL (ref 1.7–7.7)
NEUTROS PCT: 67 %
Platelets: 333 10*3/uL (ref 150–400)
RBC: 5.49 MIL/uL (ref 4.22–5.81)
RDW: 13.3 % (ref 11.5–15.5)
WBC: 10.9 10*3/uL — AB (ref 4.0–10.5)

## 2015-02-02 LAB — URINE MICROSCOPIC-ADD ON

## 2015-02-02 LAB — RAPID URINE DRUG SCREEN, HOSP PERFORMED
AMPHETAMINES: POSITIVE — AB
BARBITURATES: NOT DETECTED
Benzodiazepines: POSITIVE — AB
Cocaine: NOT DETECTED
OPIATES: NOT DETECTED
TETRAHYDROCANNABINOL: POSITIVE — AB

## 2015-02-02 LAB — I-STAT CHEM 8, ED
BUN: 7 mg/dL (ref 6–20)
CHLORIDE: 99 mmol/L — AB (ref 101–111)
Calcium, Ion: 1.22 mmol/L (ref 1.12–1.23)
Creatinine, Ser: 0.7 mg/dL (ref 0.61–1.24)
Glucose, Bld: 89 mg/dL (ref 65–99)
HEMATOCRIT: 51 % (ref 39.0–52.0)
HEMOGLOBIN: 17.3 g/dL — AB (ref 13.0–17.0)
Potassium: 3.7 mmol/L (ref 3.5–5.1)
Sodium: 139 mmol/L (ref 135–145)
TCO2: 26 mmol/L (ref 0–100)

## 2015-02-02 LAB — CBG MONITORING, ED: Glucose-Capillary: 86 mg/dL (ref 65–99)

## 2015-02-02 LAB — ETHANOL: Alcohol, Ethyl (B): <5 mg/dL (ref <5)

## 2015-02-02 MED ORDER — LORAZEPAM 2 MG/ML IJ SOLN
1.0000 mg | Freq: Once | INTRAMUSCULAR | Status: AC
  Administered 2015-02-02: 1 mg via INTRAVENOUS
  Filled 2015-02-02: qty 1

## 2015-02-02 MED ORDER — SODIUM CHLORIDE 0.9 % IV SOLN
1.0000 g | Freq: Once | INTRAVENOUS | Status: DC
  Filled 2015-02-02: qty 10

## 2015-02-02 NOTE — ED Provider Notes (Signed)
CSN: [id number]     Arrival date & time 02/02/15  1733 History   First MD Initiated Contact with Patient 02/02/15 1754     Chief Complaint  Patient presents with  . Seizures     (Consider location/radiation/quality/duration/timing/severity/associated sxs/prior Treatment) HPI   Brandon Moyer is a(n) 24 y.o. male who presents to the ED via EMS with seizure. The patient had a witnessed seizure while waiting for a plane at the airport today.  He was incontinent of urine. Patient states that he was feeling fine and then suddenly he lost consciousness. He does not remember the events. He remembers being in the ambulance on the way here. He states that he has a previous history of seizures but has little memory as it was when he was younger. He does not know if he used to take medications for a period. He denies alcohol abuse, chronic benzodiazepine use or other medications. Of note, his initial potassium is elevated to 6.6  However repeat potassium is normal.  Past Medical History  Diagnosis Date  . Anxiety   . Allergy   . Asthma   . Seizures (HCC)   . Dvt femoral (deep venous thrombosis) [facility](HCC)    Past Surgical History  Procedure Laterality Date  . Multiple tooth extractions    . Blood clot removal      Rt leg   Family History  Problem Relation Age of Onset  . Diabetes Mother    Social History  Substance Use Topics  . Smoking status: Current Every Day Smoker -- 0.80 packs/day for 10 years  . Smokeless tobacco: None  . Alcohol Use: Yes     Comment: socially    Review of Systems  Ten systems reviewed and are negative for acute change, except as noted in the HPI.    Allergies  Penicillins  Home Medications   Prior to Admission medications   Medication Sig Start Date End Date Taking? Authorizing Provider  acetaminophen (TYLENOL) 500 MG tablet Take 1,000 mg by mouth every 6 (six) hours as needed for moderate pain or headache.   Yes Historical Provider, MD  albuterol  (PROVENTIL HFA;VENTOLIN HFA) 108 (90 BASE) MCG/ACT inhaler Inhale 1-2 puffs into the lungs every 6 (six) hours as needed for wheezing or shortness of breath.   Yes Historical Provider, MD  ALPRAZolam Prudy Feeler(XANAX) 1 MG tablet Take 1 tablet (1 mg total) by mouth 2 (two) times daily as needed. For anxiety 04/06/14  Yes Raelyn Ensignodd McVeigh, PA  Calcium Carbonate Antacid (TUMS E-X PO) Take 1 tablet by mouth 2 (two) times daily as needed (acid reflux).   Yes Historical Provider, MD  cyclobenzaprine (FLEXERIL) 10 MG tablet Take 1 tablet (10 mg total) by mouth 3 (three) times daily as needed for muscle spasms. 04/06/14  Yes Todd McVeigh, PA  ibuprofen (ADVIL,MOTRIN) 200 MG tablet Take 200 mg by mouth every 6 (six) hours as needed for headache or moderate pain.   Yes Historical Provider, MD  chlorpheniramine-HYDROcodone (TUSSIONEX PENNKINETIC ER) 10-8 MG/5ML SUER Take 5 mLs by mouth 2 (two) times daily. Patient not taking: Reported on 02/02/2015 09/30/14   Carmelina DaneJeffery S Anderson, MD  pseudoephedrine-guaifenesin [facility](MUCINEX D) 60-600 MG per tablet Take 1 tablet by mouth every 12 (twelve) hours. 09/30/14 09/30/15  Carmelina DaneJeffery S Anderson, MD   BP 129/82 mmHg  Pulse 66  Temp(Src) 97.5 F (36.4 C) (Oral)  Resp 14  SpO2 100% Physical Exam  Constitutional: He is oriented to person, place, and time. He appears well-developed and  well-nourished. No distress.  HENT:  Head: Normocephalic and atraumatic.  Eyes: Conjunctivae are normal. No scleral icterus.  Neck: Normal range of motion. Neck supple.  Cardiovascular: Normal rate, regular rhythm and normal heart sounds.   Pulmonary/Chest: Effort normal and breath sounds normal. No respiratory distress.  Abdominal: Soft. There is no tenderness.  Musculoskeletal: Normal range of motion. He exhibits no edema.  Neurological: He is alert and oriented to person, place, and time.  Somnolent. Speech is slowed. Speech is clear and goal oriented, follows commands Major Cranial nerves without  deficit, no facial droop Normal strength in upper and lower extremities bilaterally including dorsiflexion and plantar flexion, strong and equal grip strength Sensation normal to light and sharp touch Moves extremities without ataxia, coordination intact Normal finger to nose and rapid alternating movements Neg romberg, no pronator drift    Skin: Skin is warm and dry. He is not diaphoretic.  Psychiatric: His behavior is normal.  Nursing note and vitals reviewed.   ED Course  Procedures (including critical care time) Labs Review Labs Reviewed  BASIC METABOLIC PANEL - Abnormal; Notable for the following:    Potassium 6.6 (*)    All other components within normal limits  CBC WITH DIFFERENTIAL/PLATELET - Abnormal; Notable for the following:    WBC 10.9 (*)    All other components within normal limits  URINE RAPID DRUG SCREEN, HOSP PERFORMED - Abnormal; Notable for the following:    Benzodiazepines POSITIVE (*)    Amphetamines POSITIVE (*)    Tetrahydrocannabinol POSITIVE (*)    All other components within normal limits  URINALYSIS, ROUTINE W REFLEX MICROSCOPIC (NOT AT [facility]) - Abnormal; Notable for the following:    APPearance TURBID (*)    Leukocytes, UA TRACE (*)    All other components within normal limits  I-STAT CHEM 8, ED - Abnormal; Notable for the following:    Chloride 99 (*)    Hemoglobin 17.3 (*)    All other components within normal limits  ETHANOL  URINE MICROSCOPIC-ADD ON  CBG MONITORING, ED    Imaging Review No results found. I have personally reviewed and evaluated these images and lab results as part of my medical decision-making.   EKG Interpretation None      MDM   Final diagnoses:  Seizure (HCC)    7:39 PM BP 129/82 mmHg  Pulse 66  Temp(Src) 97.5 F (36.4 C) (Oral)  Resp 14  SpO2 100% Patient with seizure. His urine is positive for THC, amphetamines, and benzodiazepines. Seizure is likely provoked. Labs are reassuring.  9:23 PM BP  117/68 mmHg  Pulse 68  Temp(Src) 97.5 F (36.4 C) (Oral)  Resp 18  SpO2 99% Patient observed for 3 hours. No recurrence here in the ED. I discussed the case with attending physician Dr. Gwendolyn Grant. Who does not feel the patient need s to be started on antiseizure meds. Follow up closely with Neurology.   Arthor Captain, PA-C 02/02/15 2126  Elwin Mocha, MD 02/03/15 [phone]

## 2015-02-02 NOTE — ED Notes (Signed)
Pt is up for discharge but is sleeping so soundly that he is difficult to arouse and unable to stay alert for more than a few seconds.  No acute distress. Vitals WNL

## 2015-02-02 NOTE — Discharge Instructions (Signed)
You may not drive a vehicle or operate heavy machinery until you are cleared by a neurologist. You should not drink alcohol or taken any drugs (amphetemines, marijuana, opiates) as these can provoke a seizure. Follow up as soon as possible with a neurologist.    Seizure, Adult A seizure is abnormal electrical activity in the brain. Seizures usually last from 30 seconds to 2 minutes. There are various types of seizures. Before a seizure, you may have a warning sensation (aura) that a seizure is about to occur. An aura may include the following symptoms:   Fear or anxiety.  Nausea.  Feeling like the room is spinning (vertigo).  Vision changes, such as seeing flashing lights or spots. Common symptoms during a seizure include:  A change in attention or behavior (altered mental status).  Convulsions with rhythmic jerking movements.  Drooling.  Rapid eye movements.  Grunting.  Loss of bladder and bowel control.  Bitter taste in the mouth.  Tongue biting. After a seizure, you may feel confused and sleepy. You may also have an injury resulting from convulsions during the seizure. HOME CARE INSTRUCTIONS   If you are given medicines, take them exactly as prescribed by your health care provider.  Keep all follow-up appointments as directed by your health care provider.  Do not swim or drive or engage in risky activity during which a seizure could cause further injury to you or others until your health care provider says it is OK.  Get adequate rest.  Teach friends and family what to do if you have a seizure. They should:  Lay you on the ground to prevent a fall.  Put a cushion under your head.  Loosen any tight clothing around your neck.  Turn you on your side. If vomiting occurs, this helps keep your airway clear.  Stay with you until you recover.  Know whether or not you need emergency care. SEEK IMMEDIATE MEDICAL CARE IF:  The seizure lasts longer than 5  minutes.  The seizure is severe or you do not wake up immediately after the seizure.  You have an altered mental status after the seizure.  You are having more frequent or worsening seizures. Someone should drive you to the emergency department or call local emergency services (911 in [place] MAKE SURE YOU:  Understand these instructions.  Will watch your condition.  Will get help right away if you are not doing well or get worse.   This information is not intended to replace advice given to you by your health care provider. Make sure you discuss any questions you have with your health care provider.   Document Released: 03/23/2000 Document Revised: 04/16/2014 Document Reviewed: 11/05/2012 Elsevier Interactive Patient Education [employer]2016 Elsevier Inc.

## 2015-02-02 NOTE — ED Notes (Signed)
[facility] Expected date:  Expected time:  Means of arrival:  Comments: Ems- possible seizure activity

## 2015-02-02 NOTE — ED Notes (Signed)
Pt presents to ED via EMS from the airport where he had a suspected seizure.  EMS states that information on scene was scarce but that he was on the floor and appeared to be in a post-ictal state with incontinence of urine.  No injuries noted, pt denies pain.  Pt endorses hx of seizures and anxiety but is not currently taking seizure medications.  20g IV in Rt AC inserted by EMS, all vitals WNL. No acute distress.  Pt is currently alert and oriented x 4 and ambulatory without assistance.

## 2015-09-28 ENCOUNTER — Emergency Department (HOSPITAL_COMMUNITY): Payer: BLUE CROSS/BLUE SHIELD

## 2015-09-28 ENCOUNTER — Emergency Department (HOSPITAL_COMMUNITY)
Admission: EM | Admit: 2015-09-28 | Discharge: 2015-09-28 | Disposition: A | Discharge status: Home / Self Care | Payer: BLUE CROSS/BLUE SHIELD | Attending: Emergency Medicine | Admitting: Emergency Medicine

## 2015-09-28 ENCOUNTER — Encounter (HOSPITAL_COMMUNITY): Payer: Self-pay

## 2015-09-28 DIAGNOSIS — J45909 Unspecified asthma, uncomplicated: Secondary | ICD-10-CM | POA: Diagnosis not present

## 2015-09-28 DIAGNOSIS — Z79899 Other long term (current) drug therapy: Secondary | ICD-10-CM | POA: Insufficient documentation

## 2015-09-28 DIAGNOSIS — S161XXA Strain of muscle, fascia and tendon at neck level, initial encounter: Secondary | ICD-10-CM | POA: Diagnosis not present

## 2015-09-28 DIAGNOSIS — M62830 Muscle spasm of back: Secondary | ICD-10-CM

## 2015-09-28 DIAGNOSIS — Y939 Activity, unspecified: Secondary | ICD-10-CM | POA: Insufficient documentation

## 2015-09-28 DIAGNOSIS — S2020XA Contusion of thorax, unspecified, initial encounter: Secondary | ICD-10-CM | POA: Insufficient documentation

## 2015-09-28 DIAGNOSIS — Y999 Unspecified external cause status: Secondary | ICD-10-CM | POA: Diagnosis not present

## 2015-09-28 DIAGNOSIS — F17200 Nicotine dependence, unspecified, uncomplicated: Secondary | ICD-10-CM | POA: Insufficient documentation

## 2015-09-28 DIAGNOSIS — Y92410 Unspecified street and highway as the place of occurrence of the external cause: Secondary | ICD-10-CM | POA: Insufficient documentation

## 2015-09-28 DIAGNOSIS — S299XXA Unspecified injury of thorax, initial encounter: Secondary | ICD-10-CM | POA: Diagnosis present

## 2015-09-28 DIAGNOSIS — S20211A Contusion of right front wall of thorax, initial encounter: Secondary | ICD-10-CM

## 2015-09-28 MED ORDER — HYDROCODONE-ACETAMINOPHEN 5-325 MG PO TABS
1.0000 | ORAL_TABLET | ORAL | Status: AC
  Administered 2015-09-28: 1 via ORAL
  Filled 2015-09-28: qty 1

## 2015-09-28 MED ORDER — NAPROXEN 500 MG PO TABS: 500.0000 mg | ORAL_TABLET | Freq: Two times a day (BID) | ORAL | Status: DC

## 2015-09-28 MED ORDER — CYCLOBENZAPRINE HCL 10 MG PO TABS: 10.0000 mg | ORAL_TABLET | Freq: Three times a day (TID) | ORAL | Status: DC | PRN

## 2015-09-28 NOTE — Discharge Instructions (Signed)
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
## 2015-11-19 ENCOUNTER — Encounter: Payer: Self-pay | Admitting: Family Medicine

## 2015-11-19 ENCOUNTER — Ambulatory Visit (INDEPENDENT_AMBULATORY_CARE_PROVIDER_SITE_OTHER): Payer: BLUE CROSS/BLUE SHIELD | Admitting: Family Medicine

## 2015-11-19 VITALS — BP 110/67 | HR 92 | Temp 98.8°F | Resp 18 | Ht 69.0 in | Wt 190.0 lb

## 2015-11-19 DIAGNOSIS — F411 Generalized anxiety disorder: Secondary | ICD-10-CM | POA: Diagnosis not present

## 2015-11-19 DIAGNOSIS — J069 Acute upper respiratory infection, unspecified: Secondary | ICD-10-CM

## 2015-11-19 MED ORDER — ALPRAZOLAM 1 MG PO TABS
1.0000 mg | ORAL_TABLET | Freq: Two times a day (BID) | ORAL | 0 refills | Status: DC | PRN
Start: 2015-11-19 — End: 2015-12-29

## 2015-11-19 MED ORDER — AZITHROMYCIN 250 MG PO TABS: ORAL_TABLET | ORAL | 0 refills | Status: DC

## 2015-11-19 NOTE — Progress Notes (Signed)
Brandon Moyer is a 25 y.o. male who presents to Urgent Care today for several issues:  1.  SUBJECTIVE: URI symptoms:  Brandon FreestoneSalvador Hayse is a 25 y.o. male who complains of URI symptoms present for past week or so.  Initially getting better, now feels that he's worsening.  Cough and runny nose, initially sinus now settling into lungs..   Has tried Nyquil without relief.  Sick contacts are sister.  Subjective fever. No nausea or vomiting.  Denies smoking cigarettes.  2.  Anxiety:  Long-term issue for patient.  Hasn't had any issues since 2015 however.  [place], [place].  Had to find new job.  Girlfriend and children will be moving in with him soon.  Feeling more anxious.  No SI/HI.  Has never been on long-term controller medications and states never needed these.    OBJECTIVE: BP 110/67   Pulse 92   Temp 98.8 F (37.1 C) (Oral)   Resp 18   Ht 5\' 9"  (1.753 m)   Wt 190 lb (86.2 kg)   SpO2 98%   BMI 28.06 kg/m  Gen:  Patient sitting on exam table, appears stated age in no acute distress.  Looks as if he feels unwell.  Head: Normocephalic atraumatic Eyes: EOMI, PERRL, sclera and conjunctiva non-erythematous Ears:  Canals clear bilaterally.  TMs pearly gray bilaterally without erythema or bulging.   Nose:  Nasal turbinates grossly enlarged bilaterally. Some exudates noted. Tender to palpation of maxillary sinus  Mouth: Mucosa membranes moist. Tonsils +2, nonenlarged, non-erythematous.  Throat and pharynx normal appearing. Neck: No cervical lymphadenopathy noted Heart:  RRR, no murmurs auscultated. Pulm:  Clear to auscultation bilaterally with good air movement.  No wheezes or rales noted.   Psych:  Somewhat depressed appearing todayl    Assessment and Plan:  1.  URI -- becoming LRTI: - Plan to treat due to length of symptoms and no improvement.   Will prescribe azithromycin x 5 days. Instructed patient to return in 1 week for checkup or  sooner if worsening or no improvement.   2.  Anxiety:   - Xanax to treat.  Has controlled for him in past. - If needs refill, consider adding SSRI. - declined over depression when discussed, though appeared depressed.  - FU in 2-3 weeks to assess for improvement.

## 2015-11-19 NOTE — Patient Instructions (Signed)
It was good to see you today.  I have sent in the antibiotics and the Xanax for you.  The first is for being sick and the second is for anxiety.

## 2015-12-29 ENCOUNTER — Ambulatory Visit (INDEPENDENT_AMBULATORY_CARE_PROVIDER_SITE_OTHER): Payer: BLUE CROSS/BLUE SHIELD | Admitting: Family Medicine

## 2015-12-29 VITALS — BP 120/88 | HR 90 | Temp 97.9°F | Resp 18 | Ht 69.0 in | Wt 195.0 lb

## 2015-12-29 DIAGNOSIS — F329 Major depressive disorder, single episode, unspecified: Secondary | ICD-10-CM | POA: Diagnosis not present

## 2015-12-29 DIAGNOSIS — F32A Depression, unspecified: Secondary | ICD-10-CM

## 2015-12-29 DIAGNOSIS — F411 Generalized anxiety disorder: Secondary | ICD-10-CM

## 2015-12-29 MED ORDER — ALPRAZOLAM 1 MG PO TABS: 1.0000 mg | ORAL_TABLET | Freq: Two times a day (BID) | ORAL | 0 refills | Status: DC | PRN

## 2015-12-29 MED ORDER — VENLAFAXINE HCL 37.5 MG PO TABS: 37.5000 mg | ORAL_TABLET | Freq: Two times a day (BID) | ORAL | 0 refills | Status: DC

## 2015-12-29 MED ORDER — ALPRAZOLAM 1 MG PO TABS
1.0000 mg | ORAL_TABLET | Freq: Two times a day (BID) | ORAL | 0 refills | Status: DC | PRN
Start: 2015-12-29 — End: 2015-12-29

## 2015-12-29 NOTE — Patient Instructions (Addendum)
I have sent in the Effexor for you.  Take this medicine once a day for the next 3 days, then twice daily after that.  Come back and see me in about 1 month.    Refill for the Xanax today.    I will refer you to a psychologist.   If you have any thoughts of hurting yourself, come back and see us immediately or go to the ED.      IF you received an x-ray today, you will receive an invoice from [facility]. Please contact [facility] at [phone] with questions or concerns regarding your invoice.   IF you received labwork today, you will receive an invoice from [facility]. Please contact [facility] at [phone] with questions or concerns regarding your invoice.   Our billing staff will not be able to assist you with questions regarding bills from these companies.  You will be contacted with the lab results as soon as they are available. The fastest way to get your results is to activate your My Chart account. Instructions are located on the last page of this paperwork. If you have not heard from us regarding the results in 2 weeks, please contact this office.

## 2015-12-29 NOTE — Progress Notes (Signed)
   Brandon Moyer is a 25 y.o. male who presents to [facility] today for depression:  1.  Depression:  Follow up from last visit for anxiety. Today he states that along with anxiety, depression is the major issue for him.  He comes with his dad who was with him initially, and then stepped out so we could talk.    Patient states that his girlfriend recently moved back home. He has been struggling with depression secondary to this. She was his one social support.  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  Previously was doing this in [place].  States he has been down since this moving. Lives with his father but doesn't feel he can talk with his dad.    Depressive symptoms are decreased interest in things that used to interest him.  Guilt with how he deals with his father. Alternating between not eating much and eating too much.    Asking to meet with a psychologist. Does still have anxiety symptoms occasionally.  Worse when he has nothing to do.  Hopeful that he will have job as Insurance underwritertattoo artist soon.   Has history of suicide attempt about 4 years ago.  Attempted to cut his wrist.  No history since then.  Denies any suicidal or homicidal ideations currently or in near past.    ROS as above.  No palpitations.   PMH reviewed. Patient is a nonsmoker.   Past Medical History:  Diagnosis Date  . Allergy   . Anxiety   . Asthma   . Dvt femoral (deep venous thrombosis) (HCC)   . Seizures (HCC)    Past Surgical History:  Procedure Laterality Date  . Blood Clot Removal     Rt leg  . MULTIPLE TOOTH EXTRACTIONS      Medications reviewed. Current Outpatient Prescriptions  Medication Sig Dispense Refill  . ALPRAZolam (XANAX) 1 MG tablet Take 1 tablet (1 mg total) by mouth 2 (two) times daily as needed. For anxiety 30 tablet 0  . chlorpheniramine-HYDROcodone (TUSSIONEX PENNKINETIC ER) 10-8 MG/5ML SUER Take 5 mLs by mouth 2 (two) times daily. (Patient  not taking: Reported on 12/29/2015) 60 mL 0   No current facility-administered medications for this visit.      Physical Exam:  BP 120/88 (BP Location: Right Arm, Patient Position: Sitting, Cuff Size: Large)   Pulse 90   Temp 97.9 F (36.6 C) (Oral)   Resp 18   Ht 5\' 9"  (1.753 m)   Wt 195 lb (88.5 kg)   SpO2 96%   BMI 28.80 kg/m  Gen:  Alert, cooperative patient who appears stated age in no acute distress.  Vital signs reviewed. HEENT: EOMI,  MMM Pulm:  Clear to auscultation bilaterally with good air movement.  No wheezes or rales noted.   Cardiac:  Regular rate and rhythm  Psych:  Linear and coherent thought process.  Does have somewhat flattened affect.    Assessment and Plan:  1.  Depression: - Starting effexor.  See AVS for instructions.   - Send for psych today for counseling.   - warning precautions against suicidal ideation given.   2.  Anxiety:   - refill for xanax today.  Effexor should help with this as well.  - referral for psych as above.   Entire visit 35 minutes of face time

## 2016-07-30 ENCOUNTER — Emergency Department (HOSPITAL_COMMUNITY)
Admission: EM | Admit: 2016-07-30 | Discharge: 2016-07-30 | Disposition: A | Discharge status: Home / Self Care | Payer: BLUE CROSS/BLUE SHIELD | Attending: Emergency Medicine | Admitting: Emergency Medicine

## 2016-07-30 ENCOUNTER — Emergency Department (HOSPITAL_COMMUNITY)
Admit: 2016-07-30 | Discharge: 2016-07-30 | Disposition: A | Discharge status: Home / Self Care | Payer: BLUE CROSS/BLUE SHIELD

## 2016-07-30 ENCOUNTER — Encounter (HOSPITAL_COMMUNITY): Payer: Self-pay | Admitting: *Deleted

## 2016-07-30 DIAGNOSIS — M10072 Idiopathic gout, left ankle and foot: Secondary | ICD-10-CM | POA: Diagnosis not present

## 2016-07-30 DIAGNOSIS — J45909 Unspecified asthma, uncomplicated: Secondary | ICD-10-CM | POA: Insufficient documentation

## 2016-07-30 DIAGNOSIS — M79672 Pain in left foot: Secondary | ICD-10-CM | POA: Diagnosis present

## 2016-07-30 DIAGNOSIS — M109 Gout, unspecified: Secondary | ICD-10-CM

## 2016-07-30 DIAGNOSIS — F17200 Nicotine dependence, unspecified, uncomplicated: Secondary | ICD-10-CM | POA: Insufficient documentation

## 2016-07-30 LAB — I-STAT CHEM 8, ED
BUN: 8 mg/dL (ref 6–20)
CALCIUM ION: 1.15 mmol/L (ref 1.15–1.40)
CREATININE: 0.9 mg/dL (ref 0.61–1.24)
Chloride: 93 mmol/L — ABNORMAL LOW (ref 101–111)
GLUCOSE: 80 mg/dL (ref 65–99)
HCT: 41 % (ref 39.0–52.0)
HEMOGLOBIN: 13.9 g/dL (ref 13.0–17.0)
POTASSIUM: 3.3 mmol/L — AB (ref 3.5–5.1)
Sodium: 136 mmol/L (ref 135–145)
TCO2: 31 mmol/L (ref 0–100)

## 2016-07-30 LAB — CBC WITH DIFFERENTIAL/PLATELET
Basophils Absolute: 0 10*3/uL (ref 0.0–0.1)
Basophils Relative: 0 %
EOS PCT: 2 %
Eosinophils Absolute: 0.3 10*3/uL (ref 0.0–0.7)
HEMATOCRIT: 39.4 % (ref 39.0–52.0)
Hemoglobin: 13.8 g/dL (ref 13.0–17.0)
Lymphocytes Relative: 12 %
Lymphs Abs: 1.7 10*3/uL (ref 0.7–4.0)
MCH: 29.5 pg (ref 26.0–34.0)
MCHC: 35 g/dL (ref 30.0–36.0)
MCV: 84.2 fL (ref 78.0–100.0)
Monocytes Absolute: 1.5 10*3/uL — ABNORMAL HIGH (ref 0.1–1.0)
Monocytes Relative: 10 %
NEUTROS PCT: 76 %
Neutro Abs: 11.4 10*3/uL — ABNORMAL HIGH (ref 1.7–7.7)
Platelets: 301 10*3/uL (ref 150–400)
RBC: 4.68 MIL/uL (ref 4.22–5.81)
RDW: 13.6 % (ref 11.5–15.5)
WBC: 15 10*3/uL — AB (ref 4.0–10.5)

## 2016-07-30 LAB — URIC ACID: URIC ACID, SERUM: 8 mg/dL — AB (ref 4.4–7.6)

## 2016-07-30 LAB — CK: Total CK: 144 U/L (ref 49–397)

## 2016-07-30 MED ORDER — OXYCODONE-ACETAMINOPHEN 5-325 MG PO TABS
1.0000 | ORAL_TABLET | Freq: Once | ORAL | Status: AC
  Administered 2016-07-30: 1 via ORAL

## 2016-07-30 MED ORDER — KETOROLAC TROMETHAMINE 60 MG/2ML IM SOLN
60.0000 mg | Freq: Once | INTRAMUSCULAR | Status: AC
  Administered 2016-07-30: 60 mg via INTRAMUSCULAR
  Filled 2016-07-30: qty 2

## 2016-07-30 MED ORDER — HYDROCODONE-ACETAMINOPHEN 5-325 MG PO TABS
1.0000 | ORAL_TABLET | Freq: Once | ORAL | Status: AC
  Administered 2016-07-30: 1 via ORAL
  Filled 2016-07-30: qty 1

## 2016-07-30 MED ORDER — HYDROCODONE-ACETAMINOPHEN 5-325 MG PO TABS: 1.0000 | ORAL_TABLET | Freq: Four times a day (QID) | ORAL | 0 refills | Status: DC | PRN

## 2016-07-30 MED ORDER — OXYCODONE-ACETAMINOPHEN 5-325 MG PO TABS
ORAL_TABLET | ORAL | Status: DC
Start: 2016-07-30 — End: 2016-07-30
  Filled 2016-07-30: qty 1

## 2016-07-30 MED ORDER — INDOMETHACIN 25 MG PO CAPS: 25.0000 mg | ORAL_CAPSULE | Freq: Three times a day (TID) | ORAL | 0 refills | Status: DC | PRN
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
## 2016-07-30 NOTE — Progress Notes (Signed)
VASCULAR LAB PRELIMINARY  PRELIMINARY  PRELIMINARY  PRELIMINARY  Bilateral lower extremity venous duplex completed.    Preliminary report:  There is no DVT or SVT noted in the bilateral lower extremities.  Called results to Gwyneth Sprout, MD  Rosezetta Schlatter, [facility], RVT 07/30/2016, 3:41 PM

## 2016-07-30 NOTE — ED Triage Notes (Signed)
Pt states that he noticed left foot pain and swelling that started yesterday. Pt states that he was at [facility] medical and was sent here for further eval. Denies any known injury

## 2016-08-27 ENCOUNTER — Ambulatory Visit (INDEPENDENT_AMBULATORY_CARE_PROVIDER_SITE_OTHER): Payer: BLUE CROSS/BLUE SHIELD | Admitting: Emergency Medicine

## 2016-08-27 ENCOUNTER — Encounter: Payer: Self-pay | Admitting: Emergency Medicine

## 2016-08-27 VITALS — BP 122/81 | HR 69 | Temp 97.6°F | Resp 16 | Ht 68.4 in | Wt 187.2 lb

## 2016-08-27 DIAGNOSIS — F1910 Other psychoactive substance abuse, uncomplicated: Secondary | ICD-10-CM | POA: Insufficient documentation

## 2016-08-27 DIAGNOSIS — F1120 Opioid dependence, uncomplicated: Secondary | ICD-10-CM
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
## 2016-08-27 NOTE — Patient Instructions (Signed)
Opioid Withdrawal  Opioids are powerful substances that relieve pain. Opioids include illegal drugs, such as heroin, as well as prescription pain medicines, such as codeine, morphine, hydrocodone, oxycodone, and fentanyl. Opioid withdrawal is a group of symptoms that can happen if you have been taking opioids for a long time and suddenly stop.  What are the causes?  This condition is caused by taking opioids for weeks and then doing any of the following:  · Stopping use.  · Rapidly reducing use.  · Taking a medicine to block their effect.    What increases the risk?  This condition is more likely to develop in:  · People who take opioids incorrectly.  · People who take opioids for a long period of time.    What are the signs or symptoms?  Symptoms of this condition can be physical or mental. Physical symptoms include:  · Nausea and vomiting.  · Muscle aches or spasms.  · Watery eyes and runny nose.  · Widening of the dark centers of the eyes (dilated pupils).  · Hair standing on end.  · Fever and sweating.  · Intestinal cramping and diarrhea.  · Increased blood pressure and fast pulse.    Mental symptoms include:  · Depression.  · Anxiety.  · Restlessness and irritability.  · Trouble sleeping.    When symptoms start and how long they last depends on if you have been taking an opioid that works fast and then loses its effect quickly (short acting-opioid), an opioid that works for a longer period of time (long-acting opioid), or a drug that blocks the effects of opioids.  · If you have been taking a short-acting opioid, such as heroin and oxycodone, symptoms occur within hours of stopping or reducing the amount you take. The worst symptoms (peak withdrawal) occur in 24-48 hours. Symptoms should subside in 3-5 days.  · If you have been taking a long-acting opioid, such as methadone, symptoms can occur within 30 hours of stopping or reducing the amount you take and can continue for up to 10 days.  · If you are taking a  drug that blocks the effects of opioids, such as naltrexone or naloxone, symptoms begin within minutes.    How is this diagnosed?  This condition is diagnosed based on:  · Your symptoms.  · Your medical history.  · Your history of drug and alcohol use.  · Which medicines you have been taking.    Your health care provider may:  · Perform a physical exam.  · Order tests.  · Ask that you see a mental health professional.    How is this treated?  Treatment for this condition is usually provided by mental health professionals with training in substance use disorders (addiction specialists). Treatment may involve:  · Counseling. This treatment is also called talk therapy. It is provided by substance use treatment counselors.  · Support groups. Support groups are run by people who have quit using opioids. They provide emotional support, advice, and guidance.  · Medicine. Some medicines can help to lessen certain withdrawal symptoms. Sometimes an opioid is prescribed to replace the opioid that you have been taking. You may be asked to take less and less of this opioid over time to lessen or prevent withdrawal symptoms.    Follow these instructions at home:  · Take over-the-counter and prescription medicines only as told by your health care provider.  · Check with your health care provider before starting any new medicines.  ·   Keep all follow-up visits as told by your health care provider. This is important.  Contact a health care provider if:  · You are not able to take your medicines as told.  · Your symptoms get worse.  · You take an opioid after stopping use, or you take more of an opioid than you have been.  Get help right away if:  · You have a seizure.  · You lose consciousness.  · You have serious thoughts about hurting yourself or others.  If you ever feel like you may hurt yourself or others, or have thoughts about taking your own life, get help right away. You can go to your nearest emergency department or  call:  · Your local emergency services (911 in the [place]  · A suicide crisis helpline, such as the National Suicide Prevention Lifeline at 1-800-273-8255. This is open 24 hours a day.    This information is not intended to replace advice given to you by your health care provider. Make sure you discuss any questions you have with your health care provider.  Document Released: 03/29/2003 Document Revised: 01/06/2016 Document Reviewed: 01/06/2016  Elsevier Interactive Patient Education © 2017 Elsevier Inc.

## 2017-01-23 ENCOUNTER — Encounter (HOSPITAL_COMMUNITY): Payer: Self-pay | Admitting: *Deleted

## 2017-01-23 ENCOUNTER — Encounter (HOSPITAL_COMMUNITY): Payer: Self-pay

## 2017-01-23 ENCOUNTER — Inpatient Hospital Stay (HOSPITAL_COMMUNITY)
Admission: AD | Admit: 2017-01-23 | Discharge: 2017-01-28 | DRG: 885 | Disposition: A | Discharge status: Home / Self Care | Payer: BLUE CROSS/BLUE SHIELD | Source: Intra-hospital | Attending: Psychiatry | Admitting: Psychiatry

## 2017-01-23 ENCOUNTER — Emergency Department (HOSPITAL_COMMUNITY)
Admission: EM | Admit: 2017-01-23 | Discharge: 2017-01-23 | Disposition: A | Discharge status: Other Acute Inpatient Hospital | Payer: BLUE CROSS/BLUE SHIELD | Attending: Emergency Medicine | Admitting: Emergency Medicine

## 2017-01-23 ENCOUNTER — Emergency Department (HOSPITAL_COMMUNITY): Payer: BLUE CROSS/BLUE SHIELD

## 2017-01-23 DIAGNOSIS — Z79899 Other long term (current) drug therapy: Secondary | ICD-10-CM | POA: Diagnosis not present

## 2017-01-23 DIAGNOSIS — F29 Unspecified psychosis not due to a substance or known physiological condition: Secondary | ICD-10-CM | POA: Diagnosis present

## 2017-01-23 DIAGNOSIS — G4700 Insomnia, unspecified: Secondary | ICD-10-CM | POA: Diagnosis present

## 2017-01-23 DIAGNOSIS — F12950 Cannabis use, unspecified with psychotic disorder with delusions: Secondary | ICD-10-CM | POA: Diagnosis present

## 2017-01-23 DIAGNOSIS — R5381 Other malaise: Secondary | ICD-10-CM | POA: Diagnosis not present

## 2017-01-23 DIAGNOSIS — Z880 Allergy status to penicillin: Secondary | ICD-10-CM | POA: Diagnosis not present

## 2017-01-23 DIAGNOSIS — R440 Auditory hallucinations: Secondary | ICD-10-CM | POA: Insufficient documentation

## 2017-01-23 DIAGNOSIS — F17200 Nicotine dependence, unspecified, uncomplicated: Secondary | ICD-10-CM | POA: Diagnosis present

## 2017-01-23 DIAGNOSIS — J45909 Unspecified asthma, uncomplicated: Secondary | ICD-10-CM | POA: Diagnosis present

## 2017-01-23 DIAGNOSIS — Z8669 Personal history of other diseases of the nervous system and sense organs: Secondary | ICD-10-CM

## 2017-01-23 DIAGNOSIS — R443 Hallucinations, unspecified: Secondary | ICD-10-CM | POA: Diagnosis not present

## 2017-01-23 DIAGNOSIS — Z86718 Personal history of other venous thrombosis and embolism: Secondary | ICD-10-CM | POA: Diagnosis not present

## 2017-01-23 DIAGNOSIS — F1920 Other psychoactive substance dependence, uncomplicated: Secondary | ICD-10-CM | POA: Diagnosis not present

## 2017-01-23 DIAGNOSIS — N390 Urinary tract infection, site not specified: Secondary | ICD-10-CM

## 2017-01-23 DIAGNOSIS — F411 Generalized anxiety disorder: Secondary | ICD-10-CM | POA: Diagnosis present

## 2017-01-23 DIAGNOSIS — F15222 Other stimulant dependence with intoxication with perceptual disturbance: Secondary | ICD-10-CM | POA: Diagnosis present

## 2017-01-23 DIAGNOSIS — F1310 Sedative, hypnotic or anxiolytic abuse, uncomplicated: Secondary | ICD-10-CM | POA: Diagnosis not present

## 2017-01-23 DIAGNOSIS — R21 Rash and other nonspecific skin eruption: Secondary | ICD-10-CM | POA: Diagnosis present

## 2017-01-23 DIAGNOSIS — F419 Anxiety disorder, unspecified: Secondary | ICD-10-CM | POA: Diagnosis not present

## 2017-01-23 DIAGNOSIS — R5383 Other fatigue: Secondary | ICD-10-CM | POA: Diagnosis not present

## 2017-01-23 DIAGNOSIS — R4182 Altered mental status, unspecified: Secondary | ICD-10-CM | POA: Diagnosis present

## 2017-01-23 DIAGNOSIS — R450 Nervousness: Secondary | ICD-10-CM | POA: Diagnosis not present

## 2017-01-23 DIAGNOSIS — F17210 Nicotine dependence, cigarettes, uncomplicated: Secondary | ICD-10-CM | POA: Diagnosis not present

## 2017-01-23 LAB — RAPID URINE DRUG SCREEN, HOSP PERFORMED
Amphetamines: POSITIVE — AB
BARBITURATES: NOT DETECTED
Benzodiazepines: POSITIVE — AB
Cocaine: POSITIVE — AB
OPIATES: NOT DETECTED
TETRAHYDROCANNABINOL: NOT DETECTED

## 2017-01-23 LAB — CBC WITH DIFFERENTIAL/PLATELET
BASOS ABS: 0 10*3/uL (ref 0.0–0.1)
BASOS PCT: 0 %
Eosinophils Absolute: 0 10*3/uL (ref 0.0–0.7)
Eosinophils Relative: 0 %
HEMATOCRIT: 44.3 % (ref 39.0–52.0)
HEMOGLOBIN: 16.1 g/dL (ref 13.0–17.0)
LYMPHS PCT: 7 %
Lymphs Abs: 1.6 10*3/uL (ref 0.7–4.0)
MCH: 29.2 pg (ref 26.0–34.0)
MCHC: 36.3 g/dL — ABNORMAL HIGH (ref 30.0–36.0)
MCV: 80.3 fL (ref 78.0–100.0)
Monocytes Absolute: 1.6 10*3/uL — ABNORMAL HIGH (ref 0.1–1.0)
Monocytes Relative: 8 %
NEUTROS ABS: 18.5 10*3/uL — AB (ref 1.7–7.7)
NEUTROS PCT: 85 %
Platelets: 334 10*3/uL (ref 150–400)
RBC: 5.52 MIL/uL (ref 4.22–5.81)
RDW: 13 % (ref 11.5–15.5)
WBC: 21.7 10*3/uL — ABNORMAL HIGH (ref 4.0–10.5)

## 2017-01-23 LAB — URINALYSIS, ROUTINE W REFLEX MICROSCOPIC
Bilirubin Urine: NEGATIVE
Glucose, UA: NEGATIVE mg/dL
Ketones, ur: NEGATIVE mg/dL
Nitrite: NEGATIVE
Protein, ur: 100 mg/dL — AB
Specific Gravity, Urine: 1.008 (ref 1.005–1.030)
pH: 6 (ref 5.0–8.0)

## 2017-01-23 LAB — BASIC METABOLIC PANEL
ANION GAP: 11 (ref 5–15)
BUN: 5 mg/dL — ABNORMAL LOW (ref 6–20)
CHLORIDE: 104 mmol/L (ref 101–111)
CO2: 23 mmol/L (ref 22–32)
Calcium: 9.3 mg/dL (ref 8.9–10.3)
Creatinine, Ser: 1.25 mg/dL — ABNORMAL HIGH (ref 0.61–1.24)
GFR calc non Af Amer: >60 mL/min (ref >60)
Glucose, Bld: 104 mg/dL — ABNORMAL HIGH (ref 65–99)
POTASSIUM: 3.3 mmol/L — AB (ref 3.5–5.1)
Sodium: 138 mmol/L (ref 135–145)

## 2017-01-23 LAB — ETHANOL: Alcohol, Ethyl (B): <10 mg/dL (ref <10)

## 2017-01-23 MED ORDER — CIPROFLOXACIN HCL 500 MG PO TABS
500.0000 mg | ORAL_TABLET | Freq: Two times a day (BID) | ORAL | Status: DC
  Administered 2017-01-24 – 2017-01-28 (×9): 500 mg via ORAL
  Filled 2017-01-23 (×9): qty 1
  Filled 2017-01-23: qty 2
  Filled 2017-01-23 (×4): qty 1

## 2017-01-23 MED ORDER — HYDROXYZINE HCL 25 MG PO TABS: 25.0000 mg | ORAL_TABLET | Freq: Four times a day (QID) | ORAL | Status: DC | PRN

## 2017-01-23 MED ORDER — MAGNESIUM HYDROXIDE 400 MG/5ML PO SUSP
30.0000 mL | Freq: Every day | ORAL | Status: DC | PRN
  Administered 2017-01-25 – 2017-01-27 (×3): 30 mL via ORAL
  Filled 2017-01-23 (×3): qty 30

## 2017-01-23 MED ORDER — ACETAMINOPHEN 325 MG PO TABS: 650.0000 mg | ORAL_TABLET | Freq: Four times a day (QID) | ORAL | Status: DC | PRN

## 2017-01-23 MED ORDER — CIPROFLOXACIN HCL 500 MG PO TABS
500.0000 mg | ORAL_TABLET | Freq: Two times a day (BID) | ORAL | Status: DC
  Administered 2017-01-23: 500 mg via ORAL
  Filled 2017-01-23: qty 1

## 2017-01-23 MED ORDER — TRAZODONE HCL 100 MG PO TABS
100.0000 mg | ORAL_TABLET | Freq: Every evening | ORAL | Status: DC | PRN
  Administered 2017-01-23 – 2017-01-27 (×4): 100 mg via ORAL
  Filled 2017-01-23 (×5): qty 1

## 2017-01-23 MED ORDER — RISPERIDONE 1 MG PO TABS
1.0000 mg | ORAL_TABLET | Freq: Two times a day (BID) | ORAL | Status: DC
  Administered 2017-01-23 – 2017-01-28 (×10): 1 mg via ORAL
  Filled 2017-01-23 (×17): qty 1

## 2017-01-23 MED ORDER — RISPERIDONE 1 MG PO TABS
1.0000 mg | ORAL_TABLET | Freq: Two times a day (BID) | ORAL | Status: DC
  Administered 2017-01-23: 1 mg via ORAL
  Filled 2017-01-23: qty 1

## 2017-01-23 MED ORDER — ALUM & MAG HYDROXIDE-SIMETH 200-200-20 MG/5ML PO SUSP
30.0000 mL | ORAL | Status: DC | PRN
  Administered 2017-01-24: 30 mL via ORAL
  Filled 2017-01-23: qty 30

## 2017-01-23 MED ORDER — GABAPENTIN 300 MG PO CAPS
300.0000 mg | ORAL_CAPSULE | Freq: Three times a day (TID) | ORAL | Status: DC
  Administered 2017-01-23 (×2): 300 mg via ORAL
  Filled 2017-01-23 (×2): qty 1

## 2017-01-23 MED ORDER — GABAPENTIN 300 MG PO CAPS
300.0000 mg | ORAL_CAPSULE | Freq: Three times a day (TID) | ORAL | Status: DC
  Administered 2017-01-24 – 2017-01-28 (×14): 300 mg via ORAL
  Filled 2017-01-23 (×19): qty 1

## 2017-01-23 MED ORDER — TRAZODONE HCL 100 MG PO TABS: 100.0000 mg | ORAL_TABLET | Freq: Every evening | ORAL | Status: DC | PRN

## 2017-01-23 MED ORDER — TRAZODONE HCL 50 MG PO TABS: 50.0000 mg | ORAL_TABLET | Freq: Every evening | ORAL | Status: DC | PRN
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
## 2017-01-23 NOTE — Patient Outreach (Signed)
ED Peer Support Specialist Patient Intake (Complete at intake & 30-60 Day Follow-up)  Name: Brandon Moyer  MRN: [id number]  Age: 26 y.o.   Date of Admission: 01/23/2017  Intake: Initial Comments:      Primary Reason Admitted: poly substance use including Benzodiazepines, Cocaine, and Amphetamines. Patient also experiencing auditory/visual hallucinations  Lab values: Alcohol/ETOH: Negative Positive UDS? Yes Amphetamines: Yes Barbiturates: No Benzodiazepines: Yes Cocaine: Yes Opiates: No Cannabinoids: No  Demographic information: Gender: Male Ethnicity: Latino Marital Status:   Insurance Status:   Control and instrumentation engineereceives non-medical governmental assistance (Work Engineer, agriculturalirst/Welfare, Sales executivefood stamps, etc.:   Lives with:   Living situation:    Reported Patient History: Patient reported health conditions:   Patient aware of HIV and hepatitis status:    In past year, has patient visited ED for any reason?    Number of ED visits:    Reason(s) for visit:    In past year, has patient been hospitalized for any reason?    Number of hospitalizations:    Reason(s) for hospitalization:    In past year, has patient been arrested?    Number of arrests:    Reason(s) for arrest:    In past year, has patient been incarcerated?    Number of incarcerations:    Reason(s) for incarceration:    In past year, has patient received medication-assisted treatment?    In past year, patient received the following treatments:    In past year, has patient received any harm reduction services?    Did this include any of the following?    In past year, has patient received care from a mental health provider for diagnosis other than SUD?    In past year, is this first time patient has overdosed?    Number of past overdoses:    In past year, is this first time patient has been hospitalized for an overdose?    Number of hospitalizations for overdose(s):    Is patient currently receiving treatment for a mental  health diagnosis?    Patient reports experiencing difficulty participating in SUD treatment:      Most important reason(s) for this difficulty?    Has patient received prior services for treatment?    In past, patient has received services from following agencies:    Plan of Care:  Suggested follow up at these agencies/treatment centers:    Other information: Patient did not want any help with substance use treatment or support group services at this time. Arlys JohnBrian and I left are cards and told the patient to call if he needs any help with substance use recovery resources.   [name], CPSS  01/23/2017 12:58 PM

## 2017-01-23 NOTE — ED Notes (Signed)
Patient transported to X-ray 

## 2017-01-23 NOTE — ED Notes (Signed)
Patient is resting comfortably. 
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
## 2017-01-23 NOTE — ED Notes (Signed)
[facility] for [facility] of specialized care. Pt left in no acute distress. Pt left in no acute distress.
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
## 2017-01-23 NOTE — ED Notes (Signed)
Pt resting unable to administered a full assessment.

## 2017-01-23 NOTE — ED Notes (Signed)
[facility]

## 2017-01-23 NOTE — BH Assessment (Signed)
[facility]  Per Laveda AbbeLaurie Britton Parks, FNP, this pt requires psychiatric hospitalization.  Malva LimesLinsey Strader, RN, [facility]; they will be ready to receive pt sometime after 19:30.  Pt presents under IVC initiated by EDP Donnetta HutchingBrian Cook, and [facility].  Pt's nurse, Angelique BlonderDenise, has been notified, and agrees to call report to [phone].  Pt is to be transported via Patent examinerlaw enforcement.   Doylene Canninghomas Hargis Vandyne, MA Triage Specialist [phone]

## 2017-01-23 NOTE — Tx Team (Signed)
Initial Treatment Plan 01/23/2017 10:18 PM Brandon Moyer ZOX:[id number]    PATIENT STRESSORS: Financial difficulties Legal issue Substance abuse   PATIENT STRENGTHS: Capable of independent living Physical Health   PATIENT IDENTIFIED PROBLEMS: Psychosis  Substance abuse  "I just want to get better"                 DISCHARGE CRITERIA:  Improved stabilization in mood, thinking, and/or behavior Verbal commitment to aftercare and medication compliance  PRELIMINARY DISCHARGE PLAN: Outpatient therapy Medication management  PATIENT/FAMILY INVOLVEMENT: This treatment plan has been presented to and reviewed with the patient, Brandon Moyer.  The patient and family have been given the opportunity to ask questions and make suggestions.  Levin BaconHeather V Avigdor Dollar, RN 01/23/2017, 10:18 PM

## 2017-01-23 NOTE — ED Notes (Signed)
Report given to Schering-PloughCrystal, RN ([address]). She is informed of probation and ankle bracelet.

## 2017-01-23 NOTE — ED Notes (Signed)
Received report from Kim.
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
## 2017-01-23 NOTE — ED Notes (Signed)
Pt arrived on unit. No noted distress. Denies SI/HI and say "no" to  AVH. He mentioned, "I am cold." He denies pain. Noted facial abrasion, OTA.

## 2017-01-23 NOTE — ED Notes (Signed)
Patient presented with house arrest ankle bracelet on. [employer] notified probation officers to disable and removed bracelet.

## 2017-01-24 ENCOUNTER — Other Ambulatory Visit: Payer: Self-pay

## 2017-01-24 DIAGNOSIS — F17210 Nicotine dependence, cigarettes, uncomplicated: Secondary | ICD-10-CM

## 2017-01-24 DIAGNOSIS — F15222 Other stimulant dependence with intoxication with perceptual disturbance: Secondary | ICD-10-CM

## 2017-01-24 DIAGNOSIS — F419 Anxiety disorder, unspecified: Secondary | ICD-10-CM

## 2017-01-24 DIAGNOSIS — F1920 Other psychoactive substance dependence, uncomplicated: Secondary | ICD-10-CM

## 2017-01-24 DIAGNOSIS — F12950 Cannabis use, unspecified with psychotic disorder with delusions: Secondary | ICD-10-CM

## 2017-01-24 DIAGNOSIS — R440 Auditory hallucinations: Secondary | ICD-10-CM

## 2017-01-24 DIAGNOSIS — R5383 Other fatigue: Secondary | ICD-10-CM

## 2017-01-24 DIAGNOSIS — F1310 Sedative, hypnotic or anxiolytic abuse, uncomplicated: Secondary | ICD-10-CM

## 2017-01-24 DIAGNOSIS — F29 Unspecified psychosis not due to a substance or known physiological condition: Principal | ICD-10-CM

## 2017-01-24 DIAGNOSIS — G4700 Insomnia, unspecified: Secondary | ICD-10-CM

## 2017-01-24 DIAGNOSIS — R5381 Other malaise: Secondary | ICD-10-CM

## 2017-01-24 MED ORDER — CHLORDIAZEPOXIDE HCL 25 MG PO CAPS
25.0000 mg | ORAL_CAPSULE | Freq: Four times a day (QID) | ORAL | Status: AC
  Administered 2017-01-24 – 2017-01-25 (×4): 25 mg via ORAL
  Filled 2017-01-24 (×4): qty 1

## 2017-01-24 MED ORDER — CHLORDIAZEPOXIDE HCL 25 MG PO CAPS
25.0000 mg | ORAL_CAPSULE | Freq: Four times a day (QID) | ORAL | Status: AC | PRN
  Administered 2017-01-27: 25 mg via ORAL
  Filled 2017-01-24: qty 1

## 2017-01-24 MED ORDER — CHLORDIAZEPOXIDE HCL 25 MG PO CAPS
25.0000 mg | ORAL_CAPSULE | ORAL | Status: AC
  Administered 2017-01-26 – 2017-01-27 (×2): 25 mg via ORAL
  Filled 2017-01-24 (×2): qty 1

## 2017-01-24 MED ORDER — CHLORDIAZEPOXIDE HCL 25 MG PO CAPS
25.0000 mg | ORAL_CAPSULE | Freq: Three times a day (TID) | ORAL | Status: AC
  Administered 2017-01-25 – 2017-01-26 (×3): 25 mg via ORAL
  Filled 2017-01-24 (×3): qty 1

## 2017-01-24 MED ORDER — CHLORDIAZEPOXIDE HCL 25 MG PO CAPS
25.0000 mg | ORAL_CAPSULE | Freq: Every day | ORAL | Status: AC
  Administered 2017-01-28: 25 mg via ORAL
  Filled 2017-01-24 (×2): qty 1

## 2017-01-24 MED ORDER — HYDROXYZINE HCL 25 MG PO TABS: 25.0000 mg | ORAL_TABLET | Freq: Four times a day (QID) | ORAL | Status: AC | PRN

## 2017-01-24 MED ORDER — VITAMIN B-1 100 MG PO TABS
100.0000 mg | ORAL_TABLET | Freq: Every day | ORAL | Status: DC
  Administered 2017-01-25 – 2017-01-28 (×4): 100 mg via ORAL
  Filled 2017-01-24 (×7): qty 1

## 2017-01-24 MED ORDER — THIAMINE HCL 100 MG/ML IJ SOLN
100.0000 mg | Freq: Once | INTRAMUSCULAR | Status: AC
  Administered 2017-01-24: 100 mg via INTRAMUSCULAR
  Filled 2017-01-24: qty 2

## 2017-01-24 MED ORDER — ADULT MULTIVITAMIN W/MINERALS CH
1.0000 | ORAL_TABLET | Freq: Every day | ORAL | Status: DC
  Administered 2017-01-24 – 2017-01-28 (×5): 1 via ORAL
  Filled 2017-01-24 (×9): qty 1

## 2017-01-24 MED ORDER — LOPERAMIDE HCL 2 MG PO CAPS
2.0000 mg | ORAL_CAPSULE | ORAL | Status: AC | PRN
  Administered 2017-01-25: 4 mg via ORAL
  Filled 2017-01-24: qty 2

## 2017-01-24 MED ORDER — ONDANSETRON 4 MG PO TBDP: 4.0000 mg | ORAL_TABLET | Freq: Four times a day (QID) | ORAL | Status: AC | PRN

## 2017-01-24 NOTE — Progress Notes (Signed)
Adult Psychoeducational Group Note  Date:  01/24/2017 Time:  9:09 PM  Group Topic/Focus:  Wrap-Up Group:   The focus of this group is to help patients review their daily goal of treatment and discuss progress on daily workbooks.  Participation Level:  Did Not Attend  Participation Quality:  Did not attend  Affect:  Did not attend  Cognitive:  Did not attend  Insight: None  Engagement in Group:  Did not attend  Modes of Intervention:  Did not attend  Additional Comments:  Patient did not attend wrap up group tonight.   Zenita Kister L Lynnann Knudsen 01/24/2017, 9:09 PM

## 2017-01-24 NOTE — BHH Group Notes (Signed)
LCSW Group Therapy 01/24/2017 1:15pm  Type of Therapy and Topic:  Group Therapy:  Change and Accountability  Participation Level:  Did Not Attend  Description of Group In this group, patients discussed power and accountability for change.  The group identified the challenges related to accountability and the difficulty of accepting the outcomes of negative behaviors.  Patients were encouraged to openly discuss a challenge/change they could take responsibility for.  Patients discussed the use of "change talk" and positive thinking as ways to support achievement of personal goals.  The group discussed ways to give support and empowerment to peers.  Therapeutic Goals: 1. Patients will state the relationship between personal power and accountability in the change process 2. Patients will identify the positive and negative consequences of a personal choice they have made 3. Patients will identify one challenge/choice they will take responsibility for making 4. Patients will discuss the role of "change talk" and the impact of positive thinking as it supports successful personal change 5. Patients will verbalize support and affirmation of change efforts in peers  Summary of Patient Progress:    Therapeutic Modalities Solution Focused Brief Therapy Motivational Interviewing Cognitive Behavioral Therapy  Brandon Moyer Brandon Moyer, Student-Social Work 01/24/2017 1:51 PM  

## 2017-01-24 NOTE — BHH Counselor (Signed)
Adult Comprehensive Assessment  Patient ID: Brandon Moyer, male   DOB: 18-Feb-1991, 26 y.o.   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 Family Relationships: States he is close to father here in [place], and other family in [place] / Lack of resources (include bankruptcy): Limited income Housing / Lack of housing: Lives with SO Social relationships: Significant other Substance abuse: Admits to polysubstance Use-on-going  Living/Environment/Situation:  Living Arrangements: Spouse/significant other Living conditions (as described by patient or guardian): good How long has patient lived in current situation?: 4 months What is atmosphere in current home: Comfortable, Supportive  Family History:  Marital status: Long term relationship Long term relationship, how long?: 1 year Are you sexually active?: Yes What is your sexual orientation?: heterosexual Does patient have children?: Yes How many children?: 2 How is patient's relationship with their children?: good  Childhood History:  By whom was/is the patient raised?: Both parents Additional childhood history information: Parents divorced when he was 39 Description of patient's relationship with caregiver when they were a child: Father says he was good all of the time  Patient's description of current relationship with people who raised him/her: Father reports it is good and that they respect each other  Does patient have siblings?: Yes Number of Siblings: 4 Description of patient's current relationship with siblings: Gets along with siblings  Did patient suffer any verbal/emotional/physical/sexual abuse as a child?: No Did patient suffer from severe childhood neglect?: No Has patient ever been sexually abused/assaulted/raped as an adolescent or adult?: No Was the patient ever a victim of a crime or a disaster?: No Witnessed  domestic violence?: No Has patient been effected by domestic violence as an adult?: No  Education:  Highest grade of school patient has completed: 11th  Currently a Consulting civil engineer?: No Learning disability?: No  Employment/Work Situation:   Employment situation: Unemployed Patient's job has been impacted by current illness: Yes Describe how patient's job has been impacted: Being on probation has affected his ability to work as he is actually on house arrest What is the longest time patient has a held a job?: 3 and half years  Where was the patient employed at that time?: [employer], [employer]  Has patient ever been in the [employer]?: No Has patient ever served in combat?: No Did You Receive Any Psychiatric Treatment/Services While in Equities trader?: No Are There Guns or Other Weapons in Your Home?: No Are These Weapons Safely Secured?: Yes  Financial Resources:   Financial resources:  (Limited income) Does patient have a Lawyer or guardian?: No  Alcohol/Substance Abuse:   What has been your use of drugs/alcohol within the last 12 months?: Polysubstance Use-admits to Meth primarily, with xanax and cocaine as well Alcohol/Substance Abuse Treatment Hx: Past Tx, Inpatient If yes, describe treatment[place] [facility] Has alcohol/substance abuse ever caused legal problems?: Yes (Jail time, is on probation now)  Social Support System:      Leisure/Recreation:      Strengths/Needs:      Discharge Plan:   Does patient have access to transportation?: Yes Will patient be returning to same living situation after discharge?: Yes Currently receiving community mental health services: No If no, would patient like referral for services when discharged?: No Does patient have financial barriers related to discharge medications?: Yes Patient description of barriers related to discharge medications: No income, no insurance  Summary/Recommendations:   Summary and Recommendations  (  to be completed by the evaluator): Brandon SawyersSalvador is a 26 YO Hispanic male daignosed with substance induced psychosis.  He presents with AH, which he states started about 3 days ago, and is bothersome to him.  This is in the context of amphetamine, benzo and cocaine dependence.  Brandon Moyer has spent his time mainly in bed since admission, with minimal interaction and mumbling speech which makes him difficult to understand. He is complaining of phsycial discomfort in general,, and has been started on the librium detox protocol.  At d/c, [name] states he will return home.  He does not wish to follow up with a psychiatric provider.  While here, Brandon SawyersSalvador can benefit from crises stabilization, medication management, therapeutic milieu and referral for services.  Brandon Moyer. 01/24/2017

## 2017-01-24 NOTE — BHH Counselor (Signed)
Pt has been in bed all AM, not opening eyes when addressed, mumbling one work answers in response.  Unable to interview for PSA.

## 2017-01-24 NOTE — Progress Notes (Signed)
Dar Note: Patient presents with irritable affect and mood.  Reports withdrawal symptoms of agitation, abdominal cramps, bloating, and generalized discomfort.  Reports hearing voices.  Speech was muffled and unclear.  Poor eye contact during assessment.  Medication given as prescribed.  Routine safety checks maintained.  Patient remained in his room most of this shift. Patient is safe on the unit.

## 2017-01-24 NOTE — Progress Notes (Signed)
Recreation Therapy Notes  Date: 01/24/17 Time: 1000 Location: 500 Hall Dayroom  Group Topic: Wellness  Goal Area(s) Addresses:  Patient will define components of whole wellness. Patient will verbalize benefit of whole wellness.  Intervention:  2 Decks of Cards  Activity: Deck of Chance.  Patients were given 2 cards from one deck of cards.  LRT would pull a card from another deck of cards.  Whatever card LRT pulled, patients with the corresponding card would have to complete the associated exercise.  Education: Wellness, Building control surveyorDischarge Planning.   Education Outcome: Acknowledges education/In group clarification offered/Needs additional education.   Clinical Observations/Feedback: Pt did not attend group.   Caroll RancherMarjette Alaynna Kerwood, LRT/CTRS         Caroll RancherLindsay, Babara Buffalo A 01/24/2017 12:27 PM

## 2017-01-24 NOTE — Progress Notes (Signed)
Patient ID: Brandon Moyer, male   DOB: 01/03/1991, 26 y.o.   MRN: [id number]  D: Patient has been sleeping since the beginning of shift. Pt reports minimal withdrawal symptoms. Pt reports he is tolerating medication well. Pt does have a good apetite. Pt denies SI/HI and pain.  Cooperative with assessment.  A: Medications administered as prescribed. Support and encouragement provided to attend groups and engage in milieu. Pt encouraged to discuss feelings and come to staff with any question or concerns.    R: Patient remains safe and complaint with medications.
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
## 2017-01-24 NOTE — Plan of Care (Signed)
Problem: Safety: Goal: Periods of time without injury will increase Outcome: Progressing Patient is safe and free from injury.   
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
## 2017-01-25 LAB — LIPID PANEL
CHOL/HDL RATIO: 2.5 ratio
Cholesterol: 128 mg/dL (ref 0–200)
HDL: 52 mg/dL (ref >40)
LDL CALC: 55 mg/dL (ref 0–99)
Triglycerides: 107 mg/dL (ref <150)
VLDL: 21 mg/dL (ref 0–40)

## 2017-01-25 LAB — URINE CULTURE

## 2017-01-25 LAB — HEMOGLOBIN A1C
HEMOGLOBIN A1C: 5.7 % — AB (ref 4.8–5.6)
MEAN PLASMA GLUCOSE: 116.89 mg/dL

## 2017-01-25 LAB — TSH: TSH: 1.81 u[IU]/mL (ref 0.350–4.500)

## 2017-01-25 NOTE — Plan of Care (Signed)
Problem: Safety: Goal: Periods of time without injury will increase Outcome: Progressing Patient safe and free from injury.

## 2017-01-25 NOTE — Tx Team (Signed)
Interdisciplinary Treatment and Diagnostic Plan Update  01/25/2017 Time of Session: 8:10 AM  Brandon Moyer MRN: [id number]  Principal Diagnosis: <principal problem not specified>  Secondary Diagnoses: Active Problems:   Psychosis ([place])   Current Medications:  Current Facility-Administered Medications  Medication Dose Route Frequency Provider Last Rate Last Dose  . acetaminophen (TYLENOL) tablet 650 mg  650 mg Oral Q6H PRN Ethelene Hal, NP      . alum & mag hydroxide-simeth (MAALOX/MYLANTA) 200-200-20 MG/5ML suspension 30 mL  30 mL Oral Q4H PRN Ethelene Hal, NP   30 mL at 01/24/17 1152  . chlordiazePOXIDE (LIBRIUM) capsule 25 mg  25 mg Oral Q6H PRN Nwoko, Agnes I, NP      . chlordiazePOXIDE (LIBRIUM) capsule 25 mg  25 mg Oral TID Lindell Spar I, NP       Followed by  . [START ON 01/26/2017] chlordiazePOXIDE (LIBRIUM) capsule 25 mg  25 mg Oral BH-qamhs Nwoko, Agnes I, NP       Followed by  . [START ON 01/28/2017] chlordiazePOXIDE (LIBRIUM) capsule 25 mg  25 mg Oral Daily Nwoko, Agnes I, NP      . ciprofloxacin (CIPRO) tablet 500 mg  500 mg Oral BID Ethelene Hal, NP   500 mg at 01/25/17 0806  . gabapentin (NEURONTIN) capsule 300 mg  300 mg Oral TID Ethelene Hal, NP   300 mg at 01/25/17 2992  . hydrOXYzine (ATARAX/VISTARIL) tablet 25 mg  25 mg Oral Q6H PRN Lindell Spar I, NP      . loperamide (IMODIUM) capsule 2-4 mg  2-4 mg Oral PRN Lindell Spar I, NP      . magnesium hydroxide (MILK OF MAGNESIA) suspension 30 mL  30 mL Oral Daily PRN Ethelene Hal, NP      . multivitamin with minerals tablet 1 tablet  1 tablet Oral Daily Lindell Spar I, NP   1 tablet at 01/25/17 0806  . ondansetron (ZOFRAN-ODT) disintegrating tablet 4 mg  4 mg Oral Q6H PRN Nwoko, Agnes I, NP      . risperiDONE (RISPERDAL) tablet 1 mg  1 mg Oral BID Ethelene Hal, NP   1 mg at 01/25/17 0806  . thiamine (VITAMIN B-1) tablet 100 mg  100 mg Oral Daily Lindell Spar I, NP    100 mg at 01/25/17 0806  . traZODone (DESYREL) tablet 100 mg  100 mg Oral QHS PRN Ethelene Hal, NP   100 mg at 01/23/17 2152    PTA Medications: Prescriptions Prior to Admission  Medication Sig Dispense Refill Last Dose  . ALPRAZolam (XANAX) 1 MG tablet Take 1 tablet (1 mg total) by mouth 2 (two) times daily as needed. For anxiety (Patient not taking: Reported on 01/23/2017) 30 tablet 0 Not Taking at Unknown time  . venlafaxine (EFFEXOR) 37.5 MG tablet Take 1 tablet (37.5 mg total) by mouth 2 (two) times daily. (Patient not taking: Reported on 08/27/2016) 60 tablet 0 Not Taking at Unknown time    Patient Stressors: Financial difficulties Legal issue Substance abuse  Patient Strengths: Capable of independent living Physical Health  Treatment Modalities: Medication Management, Group therapy, Case management,  1 to 1 session with clinician, Psychoeducation, Recreational therapy.   Physician Treatment Plan for Primary Diagnosis: <principal problem not specified> Long Term Goal(s): Improvement in symptoms so as ready for discharge  Short Term Goals: Ability to identify changes in lifestyle to reduce recurrence of condition will improve Ability to identify changes in lifestyle to reduce recurrence  of condition will improve  Medication Management: Evaluate patient's response, side effects, and tolerance of medication regimen.  Therapeutic Interventions: 1 to 1 sessions, Unit Group sessions and Medication administration.  Evaluation of Outcomes: Progressing  Physician Treatment Plan for Secondary Diagnosis: Active Problems:   Psychosis ([place])   Long Term Goal(s): Improvement in symptoms so as ready for discharge  Short Term Goals: Ability to identify changes in lifestyle to reduce recurrence of condition will improve Ability to identify changes in lifestyle to reduce recurrence of condition will improve  Medication Management: Evaluate patient's response, side effects, and  tolerance of medication regimen.  Therapeutic Interventions: 1 to 1 sessions, Unit Group sessions and Medication administration.  Evaluation of Outcomes: Progressing   RN Treatment Plan for Primary Diagnosis: <principal problem not specified> Long Term Goal(s): Knowledge of disease and therapeutic regimen to maintain health will improve  Short Term Goals: Ability to participate in decision making will improve and Ability to identify and develop effective coping behaviors will improve  Medication Management: RN will administer medications as ordered by provider, will assess and evaluate patient's response and provide education to patient for prescribed medication. RN will report any adverse and/or side effects to prescribing provider.  Therapeutic Interventions: 1 on 1 counseling sessions, Psychoeducation, Medication administration, Evaluate responses to treatment, Monitor vital signs and CBGs as ordered, Perform/monitor CIWA, COWS, AIMS and Fall Risk screenings as ordered, Perform wound care treatments as ordered.  Evaluation of Outcomes: Progressing   LCSW Treatment Plan for Primary Diagnosis: <principal problem not specified> Long Term Goal(s): Safe transition to appropriate next level of care at discharge, Engage patient in therapeutic group addressing interpersonal concerns.  Short Term Goals: Engage patient in aftercare planning with referrals and resources  Therapeutic Interventions: Assess for all discharge needs, 1 to 1 time with Social worker, Explore available resources and support systems, Assess for adequacy in community support network, Educate family and significant other(s) on suicide prevention, Complete Psychosocial Assessment, Interpersonal group therapy.  Evaluation of Outcomes: Met  Return home-currently refusing any follow-up   Progress in Treatment: Attending groups: Yes Participating in groups: Yes Taking medication as prescribed: Yes Toleration medication:  Yes, no side effects reported at this time Family/Significant other contact made:  Patient understands diagnosis: Yes AEB for help with voices.  Refuses referral for SA rehab Discussing patient identified problems/goals with staff: Yes Medical problems stabilized or resolved: Yes Denies suicidal/homicidal ideation: Yes Issues/concerns per patient self-inventory: None Other: N/A  New problem(s) identified: None identified at this time.   New Short Term/Long Term Goal(s): "I'm hearing voices"   Discharge Plan or Barriers:   Reason for Continuation of Hospitalization:  Hallucinations  Medication stabilization Withdrawal symptoms   Estimated Length of Stay: 10/24  Attendees: Patient: Brandon Moyer 01/25/2017  8:10 AM  Physician: Maris Berger, MD 01/25/2017  8:10 AM  Nursing: Sena Hitch, RN 01/25/2017  8:10 AM  RN Care Manager: Lars Pinks, RN 01/25/2017  8:10 AM  Social Worker: Ripley Fraise 01/25/2017  8:10 AM  Recreational Therapist: Winfield Cunas 01/25/2017  8:10 AM  Other: Norberto Sorenson 01/25/2017  8:10 AM  Other:  01/25/2017  8:10 AM    Scribe for Treatment Team:  Roque Lias LCSW 01/25/2017 8:10 AM

## 2017-01-25 NOTE — [facility] Group Notes (Signed)
LCSW Group Therapy Note   01/25/2017 1:15pm   Type of Therapy and Topic:  Group Therapy:  Positive Affirmations   Participation Level:  Did Not Attend  Description of Group: This group addressed positive affirmation toward self and others. Patients went around the room and identified two positive things about themselves and two positive things about a peer in the room. Patients reflected on how it felt to share something positive with others, to identify positive things about themselves, and to hear positive things from others. Patients were encouraged to have a daily reflection of positive characteristics or circumstances.  Therapeutic Goals 1. Patient will verbalize two of their positive qualities 2. Patient will demonstrate empathy for others by stating two positive qualities about a peer in the group 3. Patient will verbalize their feelings when voicing positive self affirmations and when voicing positive affirmations of others 4. Patients will discuss the potential positive impact on their wellness/recovery of focusing on positive traits of self and others. Summary of Patient Progress:    Therapeutic Modalities Cognitive Behavioral Therapy Motivational Interviewing  Brandon Moyer, Student-Social Work 01/25/2017 2:43 PM

## 2017-01-25 NOTE — Progress Notes (Signed)
Recreation Therapy Notes  INPATIENT RECREATION THERAPY ASSESSMENT  Patient Details Name: Brandon Moyer MRN: [id number] DOB: 02/15/1991 Today's Date: 01/25/2017  Patient Stressors:  (Pt stated he had no stressors.)  Pt stated he was here for hearing voices.  Coping Skills:   Arguments, Substance Abuse, Talking, Music, Sports  Personal Challenges: Anger, Communication, Concentration, Decision-Making, Expressing Yourself, Problem-Solving, Relationships, Self-Esteem/Confidence, Social Interaction, Stress Management, Substance Abuse, Time Management, Trusting Others, Work Nutritional therapisterformance  Leisure Interests (2+):  Social - Family, Social - Friends, Individual - TV  Awareness of Community Resources:  No  Patient Strengths:  Will power; being stubborn  Patient Identified Areas of Improvement:  Relationships with family; drugs  Current Recreation Participation:  2-3 times a week  Patient Goal for Hospitalization:  "Stop hearing voices"  [place]  [place]idence[place]  Current [place] (including self-harm):  No  Current HI:  No  Consent to Intern Participation: N/A   Caroll RancherMarjette Victorio Creeden, LRT/CTRS  Lillia AbedLindsay, Wladyslaw Henrichs A 01/25/2017, 1:12 PM

## 2017-01-25 NOTE — Progress Notes (Signed)
Recreation Therapy Notes  Date: 01/25/17 Time: 1000 Location: 500 Hall Dayroom  Group Topic: Leisure Education  Goal Area(s) Addresses:  Patient will identify positive leisure activities.  Patient will identify one positive benefit of participation in leisure activities.   Intervention: Chairs, small beach ball  Activity: Keep It Going Volleyball.  LRT seated patients in a circle.  Patients were to toss the ball back and fourth to each other without letting the ball come to a stop.  Patients could bounce the ball off the floor but the wall was to remain moving at all times.  LRT would count the number of hits on the ball.  If the ball stopped the count would start over.  Education:  Leisure Education, Building control surveyorDischarge Planning  Education Outcome: Acknowledges education/In group clarification offered/Needs additional education  Clinical Observations/Feedback: Pt did not attend group.   Caroll RancherMarjette Cynthea Zachman, LRT/CTRS         Caroll RancherLindsay, Jayquon Theiler A 01/25/2017 12:27 PM

## 2017-01-25 NOTE — Progress Notes (Signed)
DAR NOTE: Patient presents with flat affect and depressed mood.  Denies suicidal thoughts but reports auditory hallucination.  Reports withdrawal symptoms of agitation, tremors,and abdominal discomfort.  Speech is more clearer and coherent.  Rates depression at 5, hopelessness at 5, and anxiety at 10.  Maintained on routine safety checks.  Medications given as prescribed.  Support and encouragement offered as needed.  Verbalizes readiness for discharge with provider.  States goal for today is "discharge."  Patient is isolative and withdrawn to his room.  Patient is safe on the unit.

## 2017-01-26 DIAGNOSIS — R450 Nervousness: Secondary | ICD-10-CM

## 2017-01-26 DIAGNOSIS — R443 Hallucinations, unspecified: Secondary | ICD-10-CM

## 2017-01-26 LAB — PROLACTIN: Prolactin: 54.4 ng/mL — ABNORMAL HIGH (ref 4.0–15.2)

## 2017-01-26 MED ORDER — HYDROCORTISONE 1 % EX CREA
TOPICAL_CREAM | CUTANEOUS | Status: AC
  Administered 2017-01-26: 16:00:00
  Filled 2017-01-26: qty 28

## 2017-01-26 MED ORDER — HYDROCORTISONE 1 % EX CREA
TOPICAL_CREAM | Freq: Four times a day (QID) | CUTANEOUS | Status: DC | PRN
  Administered 2017-01-27: 11:00:00 via TOPICAL
  Administered 2017-01-28: 1 via TOPICAL

## 2017-01-26 NOTE — [facility] Group Notes (Signed)
[facility] Group Notes: (Clinical Social Work)   01/26/2017      Type of Therapy:  Group Therapy   Participation Level:  Did Not Attend despite MHT prompting   Ambrose MantleMareida Grossman-Orr, LCSW 01/26/2017, 12:35 PM

## 2017-01-26 NOTE — Progress Notes (Signed)
Patient ID: Brandon FreestoneSalvador Moyer, male   DOB: 02/20/1991, 26 y.o.   MRN: [id number]  D: Patient observed in dayroom watching TV on approach. Pt c/o diarrhea and anxiety this evening. Pt reports he is tolerating medication well. Pt denies SI/HI and pain.  Cooperative with assessment.  A: Medications administered as prescribed. Support and encouragement provided to attend groups and engage in milieu. Pt encouraged to discuss feelings and come to staff with any question or concerns.    R: Patient remains safe and complaint with medications.

## 2017-01-26 NOTE — Progress Notes (Signed)
D.  Pt appears drowsy on approach, in room with girlfriend who was visiting.  Pt denied complaints at this time.  Pt was observed in and out of dayroom during shift, minimal contact.  Pt denies SI/HIAVH at this time but had reported auditory hallucinations on day shift.  R.  Pt remains safe on the unit, will continue to monitor.

## 2017-01-26 NOTE — Progress Notes (Signed)
Dar Note: Patient appears drowsy and sedated.  Reports poor sleep last night.  Per roommate, patient was up most of the night talking about hurting some people.  Patient denies SI/HI, auditory and visual hallucinations.  Medications given as prescribed.  Patient stayed in his room most of this shift.  Denies any withdrawal symptoms.  Routine safety checks maintained.  Patient is safe on the unit.  Offered no complaints.
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
## 2017-01-27 LAB — CBC
HEMATOCRIT: 42.9 % (ref 39.0–52.0)
HEMOGLOBIN: 14.8 g/dL (ref 13.0–17.0)
MCH: 29.2 pg (ref 26.0–34.0)
MCHC: 34.5 g/dL (ref 30.0–36.0)
MCV: 84.8 fL (ref 78.0–100.0)
Platelets: 329 10*3/uL (ref 150–400)
RBC: 5.06 MIL/uL (ref 4.22–5.81)
RDW: 13.6 % (ref 11.5–15.5)
WBC: 10.1 10*3/uL (ref 4.0–10.5)

## 2017-01-27 NOTE — [facility] Group Notes (Addendum)
[facility] LCSW Group Therapy Note  Date/Time:  01/27/2017  11:00AM-12:00PM  Type of Therapy and Topic:  Group Therapy:  Music and Mood  Participation Level:  Did Not Attend   Description of Group: In this process group, members listened to a variety of genres of music and identified that different types of music evoke different responses.  Patients were encouraged to identify music that was soothing for them and music that was energizing for them.  Patients discussed how this knowledge can help with wellness and recovery in various ways including managing depression and anxiety as well as encouraging healthy sleep habits.    Therapeutic Goals: 1. Patients will explore the impact of different varieties of music on mood 2. Patients will verbalize the thoughts they have when listening to different types of music 3. Patients will identify music that is soothing to them as well as music that is energizing to them 4. Patients will discuss how to use this knowledge to assist in maintaining wellness and recovery 5. Patients will explore the use of music as a coping skill  Summary of Patient Progress:  N.A   Therapeutic Modalities: Solution Focused Brief Therapy Motivational Interviewing Activity   Ambrose MantleMareida Grossman-Orr, LCSW 01/27/2017 9:21 AM

## 2017-01-27 NOTE — Progress Notes (Signed)
Patient ID: Burman FreestoneSalvador Cardy, male   DOB: 06/13/1990, 26 y.o.   MRN: [id number]  DAR: Pt. Denies SI/HI and A/V Hallucinations. He refused to fill out his daily inventory sheet today. He reports that he wants to sleep until discharge as he feels ready to go home. Patient reports some itching in relation to a rash that is on his abdomen, right leg, and right arm. He is receiving PRN Hydrocortisone cream and scheduled antibiotic which he reports is helping. Support and encouragement provided to the patient. Scheduled medications administered to patient per physician's orders. Patient appears lethargic throughout most of the day. He does get up to attend meals but otherwise is only seen minimally in the milieu. He is encouraged to be more visible in the milieu however patient is resistant at this time. Q15 minute checks are maintained for safety.
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
## 2017-01-27 NOTE — Progress Notes (Signed)
[facility] Group Notes:  (Nursing/MHT/Case Management/Adjunct)  Date:  01/27/2017  Time:  9:31 PM  Type of Therapy:  Psychoeducational Skills  Participation Level:  Active  Participation Quality:  Attentive  Affect:  Depressed  Cognitive:  Appropriate  Insight:  Good  Engagement in Group:  Engaged  Modes of Intervention:  Education  Summary of Progress/Problems: Patient states that he had a good day until later in the day. He explained that his girlfriend had wanted to visit along with their 59five year old daughter but was told that she did not inquire early enough about visiting off of the unit. His support system will consist of his girlfriend and father.   Hazle CocaGOODMAN, Mella Inclan S 01/27/2017, 9:31 PM

## 2017-01-27 NOTE — Progress Notes (Signed)
D.  Pt anxious on approach, pt concerned about not being able to see his child earlier today, stated child was not allowed to visit.  Pt's father was here to visit and requested to speak to RN.  Father states that he does not feel that his son is ready for discharge.  Pt's father feels that Pt is minimizing his problems and father would like to speak to doctor.  Pt's father would like for him to go to long term treatment.  Pt was positive for evening wrap up group, participated appropriately.  A.  Support and encouragement offered, medication given as ordered  R.  Pt remains safe on the unit, will continue to monitor.

## 2017-01-28 LAB — BASIC METABOLIC PANEL
Anion gap: 17 — ABNORMAL HIGH (ref 5–15)
BUN: 9 mg/dL (ref 6–20)
CHLORIDE: 103 mmol/L (ref 101–111)
CO2: 19 mmol/L — ABNORMAL LOW (ref 22–32)
CREATININE: 1.06 mg/dL (ref 0.61–1.24)
Calcium: 9.3 mg/dL (ref 8.9–10.3)
GFR calc Af Amer: >60 mL/min (ref >60)
Glucose, Bld: 125 mg/dL — ABNORMAL HIGH (ref 65–99)
POTASSIUM: 4.3 mmol/L (ref 3.5–5.1)
SODIUM: 139 mmol/L (ref 135–145)

## 2017-01-28 MED ORDER — TRAZODONE HCL 100 MG PO TABS: 100.0000 mg | ORAL_TABLET | Freq: Every evening | ORAL | 0 refills | Status: DC | PRN

## 2017-01-28 MED ORDER — RISPERIDONE 1 MG PO TABS: 1.0000 mg | ORAL_TABLET | Freq: Two times a day (BID) | ORAL | 0 refills | Status: DC

## 2017-01-28 MED ORDER — GABAPENTIN 300 MG PO CAPS: 300.0000 mg | ORAL_CAPSULE | Freq: Three times a day (TID) | ORAL | 0 refills | Status: DC

## 2017-01-28 MED ORDER — HYDROCORTISONE 1 % EX CREA: TOPICAL_CREAM | Freq: Four times a day (QID) | CUTANEOUS | 0 refills | Status: DC | PRN

## 2017-01-28 MED ORDER — CIPROFLOXACIN HCL 500 MG PO TABS: 500.0000 mg | ORAL_TABLET | Freq: Two times a day (BID) | ORAL | Status: DC

## 2017-01-28 NOTE — Progress Notes (Signed)
Patient ID: Brandon FreestoneSalvador Reitman, male   DOB: 02/08/1991, 26 y.o.   MRN: [id number]  Pt. Denies SI/HI and A/V hallucinations. Belongings returned to patient at time of discharge. Discharge instructions and medications were reviewed with patient. Patient verbalized understanding of both medications and discharge instructions. Patient discharged to lobby, no distress noted upon discharge. Q15 minute safety checks maintained until time of discharge.

## 2017-01-28 NOTE — Progress Notes (Signed)
Recreation Therapy Notes  Date: 01/28/17 Time: 1000 Location: 500 Hall Dayroom  Group Topic: Coping Skills  Goal Area(s) Addresses:  Patients will be able to identify positive coping skills. Patients will be able to identify the benefits of using coping skills post d/c.  Behavioral Response: None  Intervention: Production designer, theatre/television/filmWeb design worksheet, pencils  Activity: OrthoptistWeb Design.  Patients were given a picture of a blank spider web.  Patients were to identify the things that have gotten them stuck and write them inside the web.  Patients were to then identify at least two coping skills for each situation they identified.  Education: PharmacologistCoping Skills, Building control surveyorDischarge Planning.   Education Outcome: Acknowledges understanding/In group clarification offered/Needs additional education.   Clinical Observations/Feedback: Pt arrived for the last five minutes of group.  Pt listened.   Caroll RancherMarjette Karrisa Didio, LRT/CTRS         Caroll RancherLindsay, Eshika Reckart A 01/28/2017 12:15 PM

## 2017-01-28 NOTE — Progress Notes (Signed)
  [facility]  Will you be returning to the same living situation after discharge:  Yes,  home At discharge, do you have transportation home?: Yes,  girlfriend Do you have the ability to pay for your medications: Yes,  insurance  Release of information consent forms completed and in the chart;  Patient's signature needed at discharge.  Patient to Follow up at: Follow-up Information    [facility] Follow up on 01/30/2017.   Specialty:  Behavioral Health Why:  Wednesday at 10:30AM for your hospial follow up appointment Contact information: [address] [place] [address] [phone]        [facility].   Why:  I was unable to get you an appointment here before you discharged from the hospital.  Call them when you get home if you are interested in being seen here.  [facility] if [facility] with this option. Contact information: [address] [place] [address] [phone]           Next level of care provider [facility]:no  Safety Planning and Suicide Prevention discussed: Yes,  yes  Have you used any form of tobacco in the last 30 days? (Cigarettes, Smokeless Tobacco, Cigars, and/or Pipes): Yes  Has patient been referred to the Quitline?: Patient refused referral  Patient has been referred for addiction treatment: Pt. refused referral  Ida RogueRodney B Jaaziel Peatross, LCSW 01/28/2017, 12:14 PM

## 2017-01-28 NOTE — Progress Notes (Signed)
Patient ID: Brandon FreestoneSalvador Moyer, male   DOB: 05/19/1990, 26 y.o.   MRN: [id number]  DAR: Pt. Denies SI/HI and A/V Hallucinations. Patient does not report any pain at this time however he does continue to report itching related to his rash. PRN Hydrocortisone provided which helps to relieve the itch. Patient refused to fill out his daily inventory sheet. Support and encouragement provided to the patient. Patient is minimal in the milieu and mostly stays in his room. He is cooperative with his medications. He reports that he feels ready for discharge. Q15 minute checks are maintained for safety.
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
## 2017-01-28 NOTE — [facility] Suicide Risk Assessment (Signed)
[facility] INPATIENT:  Family/Significant Other Suicide Prevention Education  Suicide Prevention Education:  Patient Refusal for Family/Significant Other Suicide Prevention Education: The patient Brandon Moyer has refused to provide written consent for family/significant other to be provided Family/Significant Other Suicide Prevention Education during admission and/or prior to discharge.  Physician notified.  Brandon Moyer 01/28/2017, 12:16 PM

## 2017-01-28 NOTE — Progress Notes (Signed)
Recreation Therapy Notes  INPATIENT RECREATION TR PLAN  Patient Details Name: Brandon Moyer MRN: [id number] DOB: 1991-04-02 Today's Date: 01/28/2017  Rec Therapy Plan Is patient appropriate for Therapeutic Recreation?: Yes Treatment times per week: about 3 days Estimated Length of Stay: 5-7 days TR Treatment/Interventions: Group participation (Comment)  Discharge Criteria Pt will be discharged from therapy if:: Discharged Treatment plan/goals/alternatives discussed and agreed upon by:: Patient/family  Discharge Summary Short term goals set: Patient will attend and participate in Recreation Therapy Group Sessions  Short term goals met: Adequate for discharge Progress toward goals comments: Groups attended Which groups?: Coping skills Reason goals not met: Pt attended last 5 minutes of group. Therapeutic equipment acquired: N/A Reason patient discharged from therapy: Discharge from hospital Pt/family agrees with progress & goals achieved: Yes Date patient discharged from therapy: 01/28/17   Victorino Sparrow, LRT/CTRS  Ria Comment, Travious Vanover A 01/28/2017, 12:28 PM
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
## 2017-01-28 NOTE — Plan of Care (Signed)
[facility] Particip[facility] in Recreation Therapeutic Interventions Goal: STG-Patient will attend/participate in Rec Therapy Group Ses STG-The Patient will attend and participate in Recreation Therapy Group Sessions  Outcome: Adequate for Discharge Patient attended the last few minutes of coping skills recreation therapy group session.  Caroll RancherMarjette Jayce Boyko, LRT/CTRS

## 2017-02-19 ENCOUNTER — Other Ambulatory Visit: Payer: Self-pay

## 2017-02-19 ENCOUNTER — Emergency Department (HOSPITAL_COMMUNITY)
Admission: EM | Admit: 2017-02-19 | Discharge: 2017-02-23 | Disposition: A | Discharge status: Home / Self Care | Payer: BLUE CROSS/BLUE SHIELD | Attending: Emergency Medicine | Admitting: Emergency Medicine

## 2017-02-19 ENCOUNTER — Encounter (HOSPITAL_COMMUNITY): Payer: Self-pay | Admitting: Nurse Practitioner

## 2017-02-19 DIAGNOSIS — F1120 Opioid dependence, uncomplicated: Secondary | ICD-10-CM | POA: Diagnosis not present

## 2017-02-19 DIAGNOSIS — F1510 Other stimulant abuse, uncomplicated: Secondary | ICD-10-CM | POA: Diagnosis not present

## 2017-02-19 DIAGNOSIS — F200 Paranoid schizophrenia: Secondary | ICD-10-CM | POA: Diagnosis present

## 2017-02-19 DIAGNOSIS — Z79899 Other long term (current) drug therapy: Secondary | ICD-10-CM | POA: Diagnosis not present

## 2017-02-19 DIAGNOSIS — F1910 Other psychoactive substance abuse, uncomplicated: Secondary | ICD-10-CM | POA: Insufficient documentation

## 2017-02-19 DIAGNOSIS — F1920 Other psychoactive substance dependence, uncomplicated: Secondary | ICD-10-CM | POA: Diagnosis present

## 2017-02-19 DIAGNOSIS — F17200 Nicotine dependence, unspecified, uncomplicated: Secondary | ICD-10-CM | POA: Insufficient documentation

## 2017-02-19 DIAGNOSIS — Z880 Allergy status to penicillin: Secondary | ICD-10-CM | POA: Diagnosis not present

## 2017-02-19 DIAGNOSIS — F419 Anxiety disorder, unspecified: Secondary | ICD-10-CM | POA: Diagnosis not present

## 2017-02-19 DIAGNOSIS — Z046 Encounter for general psychiatric examination, requested by authority: Secondary | ICD-10-CM | POA: Diagnosis present

## 2017-02-19 DIAGNOSIS — J45909 Unspecified asthma, uncomplicated: Secondary | ICD-10-CM | POA: Diagnosis not present

## 2017-02-19 DIAGNOSIS — F17210 Nicotine dependence, cigarettes, uncomplicated: Secondary | ICD-10-CM | POA: Diagnosis not present

## 2017-02-19 DIAGNOSIS — F1210 Cannabis abuse, uncomplicated: Secondary | ICD-10-CM | POA: Diagnosis not present

## 2017-02-19 LAB — CBC WITH DIFFERENTIAL/PLATELET
Basophils Absolute: 0 10*3/uL (ref 0.0–0.1)
Basophils Relative: 0 %
Eosinophils Absolute: 0.3 10*3/uL (ref 0.0–0.7)
Eosinophils Relative: 4 %
HCT: 42.7 % (ref 39.0–52.0)
Hemoglobin: 15.2 g/dL (ref 13.0–17.0)
Lymphocytes Relative: 28 %
Lymphs Abs: 2.2 10*3/uL (ref 0.7–4.0)
MCH: 29.6 pg (ref 26.0–34.0)
MCHC: 35.6 g/dL (ref 30.0–36.0)
MCV: 83.2 fL (ref 78.0–100.0)
Monocytes Absolute: 0.7 10*3/uL (ref 0.1–1.0)
Monocytes Relative: 9 %
Neutro Abs: 4.7 10*3/uL (ref 1.7–7.7)
Neutrophils Relative %: 59 %
Platelets: 305 10*3/uL (ref 150–400)
RBC: 5.13 MIL/uL (ref 4.22–5.81)
RDW: 13.3 % (ref 11.5–15.5)
WBC: 7.8 10*3/uL (ref 4.0–10.5)

## 2017-02-19 LAB — RAPID URINE DRUG SCREEN, HOSP PERFORMED
Amphetamines: NOT DETECTED
Barbiturates: NOT DETECTED
Benzodiazepines: NOT DETECTED
Cocaine: NOT DETECTED
Opiates: POSITIVE — AB
Tetrahydrocannabinol: NOT DETECTED

## 2017-02-19 LAB — COMPREHENSIVE METABOLIC PANEL
ALT: 20 U/L (ref 17–63)
AST: 26 U/L (ref 15–41)
Albumin: 4.3 g/dL (ref 3.5–5.0)
Alkaline Phosphatase: 74 U/L (ref 38–126)
Anion gap: 10 (ref 5–15)
BUN: 11 mg/dL (ref 6–20)
CO2: 23 mmol/L (ref 22–32)
Calcium: 9.1 mg/dL (ref 8.9–10.3)
Chloride: 103 mmol/L (ref 101–111)
Creatinine, Ser: 0.81 mg/dL (ref 0.61–1.24)
GFR calc Af Amer: >60 mL/min (ref >60)
GFR calc non Af Amer: >60 mL/min (ref >60)
Glucose, Bld: 96 mg/dL (ref 65–99)
Potassium: 3.6 mmol/L (ref 3.5–5.1)
Sodium: 136 mmol/L (ref 135–145)
Total Bilirubin: 0.9 mg/dL (ref 0.3–1.2)
Total Protein: 7.5 g/dL (ref 6.5–8.1)

## 2017-02-19 LAB — ETHANOL: Alcohol, Ethyl (B): <5 mg/dL (ref <10)

## 2017-02-19 MED ORDER — LORAZEPAM 1 MG PO TABS
1.0000 mg | ORAL_TABLET | Freq: Once | ORAL | Status: AC
  Administered 2017-02-19: 1 mg via ORAL
  Filled 2017-02-19: qty 1

## 2017-02-19 NOTE — [facility] Counselor (Signed)
Clinician attempted to contact pt's cousin Rocky Morel(Brenda Ivon [place], [phone]) who initiated pt's IVC paperwork to obtain collateral infromation. Clinician was unable to leave a message because her voicemail was not set up.    Redmond Pullingreylese D Kawika Bischoff, MS, [facility], [facility] Triage Specialist [phone]

## 2017-02-19 NOTE — ED Notes (Signed)
Pt awake,alert & responsive, no distress noted, presents under IVC by family, stating pt talking to the woods, and walking naked in the street.  Pt adamant that's he wants to leave and be with family.  Denies SI, HI.  Monitoring for safety, Q 15 min checks in effect.  Safety check for contraband completed, no items found.  Pt standing at exit doors, intermittently pushing to leave.  RN explained to pt that he could not leave dept and will speak to Psychiatrist in am.

## 2017-02-19 NOTE — ED Notes (Signed)
Patient anxious on this writer's arrival to the room. Patient stating that he needs to go home and that there has been a misunderstanding. Patient states he has to work and has a family he needs to go home to. Patient willing to cooperate to get blood and urine samples. Patient very restless and requesting Xanax which he states he has a prescription for (EDPA Trey PaulaJeff made aware). Patient repeatedly asking how long the process will take because he needs to go home.

## 2017-02-19 NOTE — ED Notes (Signed)
Bed: WA19 Expected date:  Expected time:  Means of arrival:  Comments: ems 
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
## 2017-02-19 NOTE — BH Assessment (Addendum)
Assessment Note  Brandon Moyer is an 26 y.o. male, who presents involuntary and unaccompanied to [facility]. Clinician asked the pt, "what brought you to the hospital?" Clinician noted the pt report three versions to why he is at the hospital. Pt reported, "I fell asleep, passed out, I'm a Corporate investment banker, I'm ready to go, I fell asleep, I have my ride outside waiting for me. I let them do the blood test. I can't afford to be here." Pt reported, "I feel asleep my girlfriend thought I passed out, I work Holiday representative, she got scared for some reason that is why they called." Pt reported, "my family is outside waiting on me, my dad woke up and called up here." Pt denies, SI, HI, AVH, self-injurious behaviors, access to weapons and paranoia.   Clinician attempted to contact pt's cousin who initiated pt's IVC paperwork to obtain collateral information. Clinician was unable to leave a message because her voicemail was not set up. Per IVC paperwork: "Respondent states that he doesn't care what happens to him. Respondent is staying up all night thinking someone is coming to hurt him. Respondent stands outside his house whispering into the woods. Respondent think he is talking to someone and believes someone is after him. Respondent keeps checking doors and windows to make sure they are closed. Respondent is constantly drinking alcohol and uses prescriptions drugs if he can get his hands on some. Respondent has outbursts of anger."   Pt denies abuse. Pt reported, smoking a pack of cigarettes, daily. Pt reported, drinking a beer with his father on Sunday (02/17/2017.) Pt's UDS is positive for opiates. Pt reported, being linked to "mood," for counseling. Pt denies, previous inpatient admissions. Per chart, pt was admitted to [facility] on 01/23/2017, for SI, HI and AVH.   Pt presents alert in scrubs with logical/coherent speech. Pt's mood/affect was anxious. Pt's thought process was circumstantial. Pt's judgement was  partial. Pt's concentration normal. Pt's insight and impulse control are poor. Pt reported, if discharged from [facility] he could contract for safety.   Diagnosis: F25.0 Schizoaffective disorder, Bipolar type.                    F11.20 Opioid use disorder, Moderate.  Past Medical History:  Past Medical History:  Diagnosis Date  . Allergy   . Anxiety   . Asthma   . Dvt femoral (deep venous thrombosis) (HCC)   . Seizures (HCC)     Past Surgical History:  Procedure Laterality Date  . Blood Clot Removal     Rt leg  . MULTIPLE TOOTH EXTRACTIONS      Family History:  Family History  Problem Relation Age of Onset  . Diabetes Mother     Social History:  reports that he has been smoking.  He has a 8.00 pack-year smoking history. he has never used smokeless tobacco. He reports that he drinks alcohol. He reports that he uses drugs. Drugs: Marijuana and Heroin.  Additional Social History:  Alcohol / Drug Use Pain Medications: See MAR  Prescriptions: See MAR Over the Counter: See MAR History of alcohol / drug use?: Yes Substance #1 Name of Substance 1: Opiates 1 - Age of First Use: UTA 1 - Amount (size/oz): Pt's UDS is positive for opiates.  1 - Frequency: UTA 1 - Duration: UTA 1 - Last Use / Amount: UTA Substance #2 Name of Substance 2: Cigarettes.  2 - Age of First Use: UTA 2 - Amount (size/oz): Pt reported, smoking a  pack of cigarettes , daily.  2 - Frequency: Daily.  2 - Duration: Ongoing.  2 - Last Use / Amount: Pt reported, daily.  Substance #3 Name of Substance 3: Alcohol.  3 - Age of First Use: UTA 3 - Amount (size/oz): Pt reported, drinking a beer on Sunday (02/17/2017.)  3 - Frequency: Varies.  3 - Duration: UTA 3 - Last Use / Amount: Pt reported, in Sunday (02/17/2017) with is dad.   CIWA: CIWA-Ar BP: 121/90 Pulse Rate: 91 COWS:    Allergies:  Allergies  Allergen Reactions  . Penicillins Hives    Has patient had a PCN reaction causing immediate rash,  facial/tongue/throat swelling, SOB or lightheadedness with hypotension: Yes Has patient had a PCN reaction causing severe rash involving mucus membranes or skin necrosis: Yes Has patient had a PCN reaction that required hospitalization Yes Has patient had a PCN reaction occurring within the last 10 years: Yes If all of the above answers are "NO", then may proceed with Cephalosporin use.      Home Medications:  (Not in a hospital admission)  OB/GYN Status:  No LMP for male patient.  General Assessment Data Location of Assessment: [facility] ED TTS Assessment: In system Is this a Tele or Face-to-Face Assessment?: Face-to-Face Is this an Initial Assessment or a Re-assessment for this encounter?: Initial Assessment Marital status: Married Is patient pregnant?: No Pregnancy Status: No Living Arrangements: Spouse/significant other, Children Can pt return to current living arrangement?: (UTA) Admission Status: Involuntary Referral Source: Other([employer]) Insurance type: BCBS     Crisis Care Plan Living Arrangements: Spouse/significant other, Children Legal Guardian: Other:(Self) Name of Psychiatrist: NA Name of Therapist: Pt reported, "mood."   Education Status Is patient currently in school?: No Current Grade: NA Highest grade of school patient has completed: 12th grade.  Name of school: NA Contact person: NA  Risk to self with the past 6 months Suicidal Ideation: No(Pt denies. ) Has patient been a risk to self within the past 6 months prior to admission? : No Suicidal Intent: No Has patient had any suicidal intent within the past 6 months prior to admission? : No Is patient at risk for suicide?: No Suicidal Plan?: No Has patient had any suicidal plan within the past 6 months prior to admission? : No Access to Means: No What has been your use of drugs/alcohol within the last 12 months?: Opiates, alochol and cigarettes. Previous Attempts/Gestures: No(Pt denies. ) How many times?:  0 Other Self Harm Risks: Pt denies.  Triggers for Past Attempts: None known Intentional Self Injurious Behavior: None(Pt denies. ) Family Suicide History: No Recent stressful life event(s): Other (Comment)(UTA) Persecutory voices/beliefs?: No Depression: No(Pt denies. ) Depression Symptoms: (Pt denies. ) Substance abuse history and/or treatment for substance abuse?: Yes Suicide prevention information given to non-admitted patients: Not applicable  Risk to Others within the past 6 months Homicidal Ideation: No(Pt denies. ) Does patient have any lifetime risk of violence toward others beyond the six months prior to admission? : No Thoughts of Harm to Others: No Current Homicidal Intent: No Current Homicidal Plan: No Access to Homicidal Means: No Identified Victim: NA History of harm to others?: No Assessment of Violence: None Noted Violent Behavior Description: NA Does patient have access to weapons?: Yes (Comment)(Musket. Pt reported, it is decoration. ) Criminal Charges Pending?: No Does patient have a court date: No Is patient on probation?: No  Psychosis Hallucinations: Visual, Auditory(Per IVC. Pt denies. ) Delusions: Unspecified(Per IVC. Pt denies. )  Mental  Status Report Appearance/Hygiene: In scrubs Eye Contact: Poor Motor Activity: Unremarkable Speech: Logical/coherent Level of Consciousness: Alert Mood: Anxious Affect: Anxious Anxiety Level: Moderate Thought Processes: Circumstantial Judgement: Partial Orientation: Other (Comment)(year, city and state.) Obsessive Compulsive Thoughts/Behaviors: None  Cognitive Functioning Concentration: Normal Memory: Recent Impaired IQ: Average Insight: Poor Impulse Control: Poor Appetite: Good Sleep: No Change Total Hours of Sleep: (Pt reported, sleeping like a baby. ) Vegetative Symptoms: None  ADLScreening [facility] Assessment Services) Patient's cognitive ability adequate to safely complete daily activities?:  Yes Patient able to express need for assistance with ADLs?: Yes Independently performs ADLs?: Yes (appropriate for developmental age)  Prior Inpatient Therapy Prior Inpatient Therapy: Yes Prior Therapy Dates: 01/23/2017 Prior Therapy Facilty/Provider(s): Si, [facility], [facility].   Prior Outpatient Therapy Prior Outpatient Therapy: Yes Prior Therapy Dates: Current Prior Therapy Facilty/Provider(s): Pt reported, "mood."  Reason for Treatment: Pt reported, "counseling."  Does patient have an ACCT team?: No Does patient have Intensive In-House Services?  : No Does patient have [facility] services? : No Does patient have P4CC services?: No  ADL Screening (condition at time of admission) Patient's cognitive ability adequate to safely complete daily activities?: Yes Is the patient deaf or have difficulty hearing?: No Does the patient have difficulty seeing, even when wearing glasses/contacts?: No Does the patient have difficulty concentrating, remembering, or making decisions?: No Patient able to express need for assistance with ADLs?: Yes Does the patient have difficulty dressing or bathing?: No Independently performs ADLs?: Yes (appropriate for developmental age) Does the patient have difficulty walking or climbing stairs?: No Weakness of Legs: None Weakness of Arms/Hands: None  Home Assistive Devices/Equipment Home Assistive Devices/Equipment: None    Abuse/Neglect Assessment (Assessment to be complete while patient is alone) Abuse/Neglect Assessment Can Be Completed: Yes Physical Abuse: Denies(Pt denies. ) Verbal Abuse: Denies(Pt denies. ) Sexual Abuse: Denies(Pt denies. ) Exploitation of patient/patient's resources: Denies(Pt denies. ) Self-Neglect: Denies(Pt denies. )     Advance Directives (For Healthcare) Does Patient Have a Medical Advance Directive?: No Would patient like information on creating a medical advance directive?: No - Patient declined    Additional Information 1:1 In  Past 12 Months?: No CIRT Risk: No Elopement Risk: No Does patient have medical clearance?: Yes     Disposition: Disposition: Donell SievertSpencer Simon, PA recommends inpatient treatment. [facility]ori[facility] Disposition was discussed with Dr. Criss AlvineGoldston and Joanie CoddingtonLatricia, RN. TTS to seek placement.   Disposition Initial Assessment Completed for this Encounter: Yes Disposition of Patient: Inpatient treatment program Type of inpatient treatment program: Adult  On Site Evaluation by:  Jenny Reichmannreylese Keoni Havey, MS, LPC, CRC. Reviewed with Physician:  Dr. Criss AlvineGoldston and Donell SievertSpencer Simon, PA.  Redmond Pullingreylese D Alexiya Franqui 02/19/2017 9:22 PM   Redmond Pullingreylese D Foster Sonnier, MS, [facility], [facility] Triage Specialist [phone]

## 2017-02-19 NOTE — ED Notes (Signed)
Pt continues to stand at exit doors, requesting to leave dept.  RN explained Psychiatrist will be here in the am to speak with the pt.

## 2017-02-20 DIAGNOSIS — F1510 Other stimulant abuse, uncomplicated: Secondary | ICD-10-CM

## 2017-02-20 DIAGNOSIS — R451 Restlessness and agitation: Secondary | ICD-10-CM | POA: Diagnosis not present

## 2017-02-20 DIAGNOSIS — F22 Delusional disorders: Secondary | ICD-10-CM | POA: Diagnosis not present

## 2017-02-20 DIAGNOSIS — R454 Irritability and anger: Secondary | ICD-10-CM

## 2017-02-20 DIAGNOSIS — F200 Paranoid schizophrenia: Secondary | ICD-10-CM | POA: Diagnosis present

## 2017-02-20 DIAGNOSIS — R443 Hallucinations, unspecified: Secondary | ICD-10-CM | POA: Diagnosis not present

## 2017-02-20 DIAGNOSIS — F1210 Cannabis abuse, uncomplicated: Secondary | ICD-10-CM | POA: Diagnosis not present

## 2017-02-20 DIAGNOSIS — R4587 Impulsiveness: Secondary | ICD-10-CM | POA: Diagnosis not present

## 2017-02-20 DIAGNOSIS — F17210 Nicotine dependence, cigarettes, uncomplicated: Secondary | ICD-10-CM | POA: Diagnosis not present

## 2017-02-20 DIAGNOSIS — R4586 Emotional lability: Secondary | ICD-10-CM | POA: Diagnosis not present

## 2017-02-20 MED ORDER — ZIPRASIDONE MESYLATE 20 MG IM SOLR
20.0000 mg | Freq: Once | INTRAMUSCULAR | Status: AC
  Administered 2017-02-20: 20 mg via INTRAMUSCULAR
  Filled 2017-02-20: qty 20

## 2017-02-20 MED ORDER — HYDROXYZINE HCL 25 MG PO TABS
50.0000 mg | ORAL_TABLET | Freq: Once | ORAL | Status: DC
  Filled 2017-02-20 (×2): qty 2

## 2017-02-20 MED ORDER — ZIPRASIDONE MESYLATE 20 MG IM SOLR
20.0000 mg | Freq: Once | INTRAMUSCULAR | Status: AC | PRN
  Administered 2017-02-21: 20 mg via INTRAMUSCULAR
  Filled 2017-02-20: qty 20

## 2017-02-20 MED ORDER — DIPHENHYDRAMINE HCL 50 MG/ML IJ SOLN
50.0000 mg | Freq: Once | INTRAMUSCULAR | Status: AC | PRN
  Administered 2017-02-21: 50 mg via INTRAMUSCULAR
  Filled 2017-02-20: qty 1

## 2017-02-20 MED ORDER — HYDROXYZINE HCL 25 MG PO TABS
50.0000 mg | ORAL_TABLET | Freq: Once | ORAL | Status: AC
  Administered 2017-02-20: 50 mg via ORAL
  Filled 2017-02-20: qty 2

## 2017-02-20 MED ORDER — GABAPENTIN 300 MG PO CAPS
300.0000 mg | ORAL_CAPSULE | Freq: Three times a day (TID) | ORAL | Status: DC
  Administered 2017-02-21 – 2017-02-23 (×4): 300 mg via ORAL
  Filled 2017-02-20 (×6): qty 1

## 2017-02-20 MED ORDER — LORAZEPAM 2 MG/ML IJ SOLN
2.0000 mg | Freq: Once | INTRAMUSCULAR | Status: AC | PRN
  Administered 2017-02-21: 2 mg via INTRAMUSCULAR
  Filled 2017-02-20: qty 1

## 2017-02-20 MED ORDER — STERILE WATER FOR INJECTION IJ SOLN
INTRAMUSCULAR | Status: AC
  Administered 2017-02-20: 14:00:00
  Filled 2017-02-20: qty 10

## 2017-02-20 MED ORDER — DIPHENHYDRAMINE HCL 50 MG/ML IJ SOLN
50.0000 mg | Freq: Once | INTRAMUSCULAR | Status: AC
  Administered 2017-02-20: 50 mg via INTRAMUSCULAR
  Filled 2017-02-20: qty 1

## 2017-02-20 MED ORDER — QUETIAPINE FUMARATE 50 MG PO TABS
50.0000 mg | ORAL_TABLET | Freq: Once | ORAL | Status: AC
  Administered 2017-02-20: 50 mg via ORAL
  Filled 2017-02-20: qty 1

## 2017-02-20 MED ORDER — LORAZEPAM 2 MG/ML IJ SOLN
2.0000 mg | Freq: Once | INTRAMUSCULAR | Status: AC
  Administered 2017-02-20: 2 mg via INTRAMUSCULAR
  Filled 2017-02-20: qty 1

## 2017-02-20 MED ORDER — QUETIAPINE FUMARATE 50 MG PO TABS
50.0000 mg | ORAL_TABLET | Freq: Once | ORAL | Status: DC
  Filled 2017-02-20 (×2): qty 1

## 2017-02-20 NOTE — ED Notes (Signed)
Pt sedated, sleeping at present, no distress noted, calm at present.  Monitoring for safety, Q 15 min checks in effect.

## 2017-02-20 NOTE — ED Notes (Signed)
VS not taken because pt is sleeping after IM medication administration.
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
## 2017-02-20 NOTE — Progress Notes (Signed)
02/20/17 1401:  LRT informed by staff to let pt remain in room.   Caroll RancherMarjette Illeana Edick, LRT/CTRS
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
## 2017-02-20 NOTE — ED Notes (Signed)
Patient sleeping at this time Brandon MostRn Latricia is aware

## 2017-02-21 DIAGNOSIS — F1510 Other stimulant abuse, uncomplicated: Secondary | ICD-10-CM | POA: Diagnosis not present

## 2017-02-21 DIAGNOSIS — F1210 Cannabis abuse, uncomplicated: Secondary | ICD-10-CM | POA: Diagnosis not present

## 2017-02-21 DIAGNOSIS — F1120 Opioid dependence, uncomplicated: Secondary | ICD-10-CM

## 2017-02-21 DIAGNOSIS — F17210 Nicotine dependence, cigarettes, uncomplicated: Secondary | ICD-10-CM | POA: Diagnosis not present

## 2017-02-21 DIAGNOSIS — F200 Paranoid schizophrenia: Secondary | ICD-10-CM | POA: Diagnosis not present

## 2017-02-21 MED ORDER — LORAZEPAM 2 MG/ML IJ SOLN
1.0000 mg | Freq: Once | INTRAMUSCULAR | Status: AC
  Administered 2017-02-21: 1 mg via INTRAMUSCULAR
  Filled 2017-02-21: qty 1

## 2017-02-21 MED ORDER — CARBAMAZEPINE ER 200 MG PO TB12
200.0000 mg | ORAL_TABLET | Freq: Two times a day (BID) | ORAL | Status: DC
  Administered 2017-02-21 – 2017-02-23 (×4): 200 mg via ORAL
  Filled 2017-02-21 (×4): qty 1

## 2017-02-21 MED ORDER — STERILE WATER FOR INJECTION IJ SOLN
INTRAMUSCULAR | Status: AC
Start: 2017-02-21 — End: 2017-02-21
  Administered 2017-02-21: 10 mL
  Filled 2017-02-21: qty 10

## 2017-02-21 MED ORDER — ASENAPINE MALEATE 5 MG SL SUBL
10.0000 mg | SUBLINGUAL_TABLET | Freq: Two times a day (BID) | SUBLINGUAL | Status: DC
  Administered 2017-02-22 – 2017-02-23 (×3): 10 mg via SUBLINGUAL
  Filled 2017-02-21 (×3): qty 2

## 2017-02-21 MED ORDER — STERILE WATER FOR INJECTION IJ SOLN
INTRAMUSCULAR | Status: AC
  Filled 2017-02-21: qty 10

## 2017-02-21 MED ORDER — ZIPRASIDONE MESYLATE 20 MG IM SOLR
20.0000 mg | Freq: Once | INTRAMUSCULAR | Status: AC
  Administered 2017-02-21: 20 mg via INTRAMUSCULAR
  Filled 2017-02-21: qty 20

## 2017-02-21 NOTE — ED Provider Notes (Signed)
7:40 PM patient is taking and punching doors and attempting to leave the emergency department.  He is currently under involuntary commitment.  I was called by nurse Kathryne HitchLondon.  Intramuscular Geodon and intramuscular Ativan ordered.   Doug SouJacubowitz, Annika Selke, MD 02/21/17 2014

## 2017-02-21 NOTE — ED Notes (Signed)
Pt is visiting calmly with his father.  He wants to go home but I explained that he was under IVC.  Pt was a little agitated at first but was able to calm down with the help of his father.
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
## 2017-02-21 NOTE — ED Notes (Signed)
Pt attempting to leave dept, kicking and punching doors.  Dr Ethelda ChickJacubowitz notified.  Pending meds.

## 2017-02-21 NOTE — Progress Notes (Signed)
02/21/17 1405:  LRT went to offer pt activities, pt was sleep.   Caroll RancherMarjette Cabria Micalizzi, LRT/CTRS

## 2017-02-21 NOTE — ED Notes (Signed)
Pt is writing threatening notes to police.  Pt is intrusive and walking in other patient rooms.  We are unable to redirect pt..Marland Kitchen

## 2017-02-21 NOTE — BH Assessment (Signed)
[facility]  Please see Brandon Dickensrin [place], Brandon Moyer's note dated 02/21/2017 @ 13:42.  [facility] and at 15:48 [name] confirms receipt.  Brandon Canninghomas Ulice Follett, MA Triage Specialist [phone]

## 2017-02-21 NOTE — Progress Notes (Addendum)
UPDATE 3:20PM: patient is on priority waitlist at this time- IVC paperwork is being requested.   CSW spoke with [facility]Bar[facility] and confirmed [facility] was received and initial intake was completed.   Stacy GardnerErin Darleth Eustache, [facility] Emergency Room Clinical Social Worker [phone]

## 2017-02-22 DIAGNOSIS — F1120 Opioid dependence, uncomplicated: Secondary | ICD-10-CM | POA: Diagnosis not present

## 2017-02-22 DIAGNOSIS — F17210 Nicotine dependence, cigarettes, uncomplicated: Secondary | ICD-10-CM | POA: Diagnosis not present

## 2017-02-22 DIAGNOSIS — F1510 Other stimulant abuse, uncomplicated: Secondary | ICD-10-CM | POA: Diagnosis not present

## 2017-02-22 DIAGNOSIS — F1210 Cannabis abuse, uncomplicated: Secondary | ICD-10-CM | POA: Diagnosis not present

## 2017-02-22 DIAGNOSIS — F200 Paranoid schizophrenia: Secondary | ICD-10-CM | POA: Diagnosis not present

## 2017-02-22 MED ORDER — STERILE WATER FOR INJECTION IJ SOLN
INTRAMUSCULAR | Status: AC
  Filled 2017-02-22: qty 10

## 2017-02-22 MED ORDER — LORAZEPAM 2 MG/ML IJ SOLN
2.0000 mg | Freq: Once | INTRAMUSCULAR | Status: DC
  Filled 2017-02-22: qty 1

## 2017-02-22 MED ORDER — TRAZODONE HCL 50 MG PO TABS
50.0000 mg | ORAL_TABLET | Freq: Every evening | ORAL | Status: DC | PRN
  Administered 2017-02-22: 50 mg via ORAL
  Filled 2017-02-22: qty 1

## 2017-02-22 MED ORDER — ZIPRASIDONE MESYLATE 20 MG IM SOLR
20.0000 mg | Freq: Once | INTRAMUSCULAR | Status: DC
  Filled 2017-02-22: qty 20

## 2017-02-22 MED ORDER — DIPHENHYDRAMINE HCL 50 MG/ML IJ SOLN
50.0000 mg | Freq: Once | INTRAMUSCULAR | Status: DC
  Filled 2017-02-22: qty 1

## 2017-02-22 NOTE — ED Notes (Signed)
Report to include Situation, Background, Assessment, and Recommendations received from Diane RN. Patient alert and oriented, warm and dry, in no acute distress. Patient denies SI, HI, [facility] and pain. Patient made aware of Q15 minute rounds and security cameras for their safety. Patient instructed to come to me with needs or concerns. 

## 2017-02-22 NOTE — BH Assessment (Signed)
[facility] Assessment Progress Note  At 08:43 this writer called [facility] and spoke to Barbara.  She reports that this pt remains on their wait list as a priority referral.  Chauna Osoria, MA Triage Specialist [phone]     

## 2017-02-22 NOTE — ED Notes (Signed)
Hourly rounding reveals patient sleeping in room. No complaints, stable, in no acute distress. Q15 minute rounds and monitoring via Security Cameras to continue. 

## 2017-02-22 NOTE — Progress Notes (Signed)
02/22/17 1345:  Informed by staff to let pt remain sleep.   Caroll RancherMarjette Alp Goldwater, LRT/CTRS

## 2017-02-22 NOTE — ED Notes (Signed)
Pt. C/o insomnia. 

## 2017-02-22 NOTE — ED Notes (Signed)
Hourly rounding reveals patient in room. No complaints, stable, in no acute distress. Snack and beverage given.

## 2017-02-22 NOTE — ED Notes (Signed)
Pt is very drowsy today.  He visited with his girlfriend for a while but is sleeping when left alone.  He has remained calm and cooperative today.
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
## 2017-02-23 MED ORDER — ASENAPINE MALEATE 5 MG SL SUBL: 10.0000 mg | SUBLINGUAL_TABLET | Freq: Two times a day (BID) | SUBLINGUAL | 0 refills | Status: DC

## 2017-02-23 MED ORDER — GABAPENTIN 300 MG PO CAPS: 300.0000 mg | ORAL_CAPSULE | Freq: Three times a day (TID) | ORAL | 0 refills | Status: DC

## 2017-02-23 MED ORDER — CARBAMAZEPINE ER 200 MG PO TB12: 200.0000 mg | ORAL_TABLET | Freq: Two times a day (BID) | ORAL | 0 refills | Status: DC

## 2017-02-23 MED ORDER — TRAZODONE HCL 50 MG PO TABS: 50.0000 mg | ORAL_TABLET | Freq: Every evening | ORAL | 0 refills | Status: AC | PRN

## 2017-02-23 NOTE — ED Notes (Signed)
Hourly rounding reveals patient sleeping in room. No complaints, stable, in no acute distress. Q15 minute rounds and monitoring via Security Cameras to continue. 

## 2017-02-23 NOTE — ED Notes (Signed)
On the phone 
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
## 2017-02-23 NOTE — ED Notes (Signed)
On the phone to call for ride. 

## 2017-02-23 NOTE — ED Notes (Signed)
IVC has been rescinded per CSW

## 2017-02-23 NOTE — ED Notes (Addendum)
Written dc instructions and prescriptions reviewed with pt.  Pt encouraged to take his medications as directed and follow up with [facility] this week.  Pt denies si/hi/avh on dc.  Pt encouraged to seek treatment for  return of sh/hi/avh for any concerns/changes.  Pt verbalized understanding.

## 2017-02-23 NOTE — ED Notes (Signed)
Peer support reports that talked to pt yesterday and he is not interested in their service

## 2017-02-23 NOTE — ED Notes (Signed)
Up to the bathroom 

## 2017-02-23 NOTE — ED Notes (Addendum)
Pt ambulatory w/o difficulty to dc area with RN, belongings returned after leaving the area. Pt ambulatory to waiting room, Pt's girlfriend will be here shortly to pick him up.

## 2017-02-23 NOTE — Progress Notes (Addendum)
CSW called [facility] [phone]) and spoke with Marylene LandAngela from admissions. CSW informed Marylene Landngela that [facility] because pt is discharging home and no longer in need of inpatient treatment. Marylene Landngela expressed her understanding.  Jonathon JordanLynn B Wende Longstreth, MSW, [facility] ED Social Worker Covering

## 2017-04-18 ENCOUNTER — Telehealth (HOSPITAL_COMMUNITY): Payer: Self-pay | Admitting: *Deleted

## 2017-04-18 NOTE — Telephone Encounter (Signed)
Prior authorization for [facility] received. Upon chart review it was noticed that patient is not seen in the outpatient department. Authorization must be complete by his current outpatient provider.

## 2017-07-07 DIAGNOSIS — J45909 Unspecified asthma, uncomplicated: Secondary | ICD-10-CM | POA: Insufficient documentation

## 2017-07-07 DIAGNOSIS — F17200 Nicotine dependence, unspecified, uncomplicated: Secondary | ICD-10-CM | POA: Insufficient documentation

## 2017-07-07 DIAGNOSIS — R443 Hallucinations, unspecified: Secondary | ICD-10-CM | POA: Insufficient documentation

## 2017-07-07 NOTE — ED Triage Notes (Addendum)
Pt states he is hearing voices of people saying they want to kill him and he's seeing them with guns. He is somewhat anxious and passing in the exam room, endorses "heavy drug use, states I take all kinds of drugs." Further who is accompanying him is requesting he be evaluated. Denies being suicidal.

## 2017-07-08 ENCOUNTER — Emergency Department (HOSPITAL_COMMUNITY)
Admission: EM | Admit: 2017-07-08 | Discharge: 2017-07-08 | Disposition: A | Discharge status: Home / Self Care | Payer: BLUE CROSS/BLUE SHIELD | Attending: Emergency Medicine | Admitting: Emergency Medicine

## 2017-07-08 ENCOUNTER — Encounter (HOSPITAL_COMMUNITY): Payer: Self-pay | Admitting: Nurse Practitioner

## 2017-07-08 DIAGNOSIS — F15222 Other stimulant dependence with intoxication with perceptual disturbance: Secondary | ICD-10-CM | POA: Diagnosis present

## 2017-07-08 DIAGNOSIS — R443 Hallucinations, unspecified: Secondary | ICD-10-CM

## 2017-07-08 DIAGNOSIS — F29 Unspecified psychosis not due to a substance or known physiological condition: Secondary | ICD-10-CM

## 2017-07-08 LAB — COMPREHENSIVE METABOLIC PANEL
ALK PHOS: 61 U/L (ref 38–126)
ALT: 22 U/L (ref 17–63)
ANION GAP: 15 (ref 5–15)
AST: 29 U/L (ref 15–41)
Albumin: 4.5 g/dL (ref 3.5–5.0)
BUN: 12 mg/dL (ref 6–20)
CALCIUM: 9 mg/dL (ref 8.9–10.3)
CO2: 21 mmol/L — AB (ref 22–32)
CREATININE: 1.04 mg/dL (ref 0.61–1.24)
Chloride: 102 mmol/L (ref 101–111)
GFR calc Af Amer: >60 mL/min (ref >60)
GFR calc non Af Amer: >60 mL/min (ref >60)
Glucose, Bld: 101 mg/dL — ABNORMAL HIGH (ref 65–99)
Potassium: 3 mmol/L — ABNORMAL LOW (ref 3.5–5.1)
SODIUM: 138 mmol/L (ref 135–145)
Total Bilirubin: 1.2 mg/dL (ref 0.3–1.2)
Total Protein: 8 g/dL (ref 6.5–8.1)

## 2017-07-08 LAB — RAPID URINE DRUG SCREEN, HOSP PERFORMED
Amphetamines: POSITIVE — AB
Barbiturates: NOT DETECTED
Benzodiazepines: NOT DETECTED
COCAINE: POSITIVE — AB
OPIATES: NOT DETECTED
TETRAHYDROCANNABINOL: NOT DETECTED

## 2017-07-08 LAB — CBC
HCT: 40.4 % (ref 39.0–52.0)
Hemoglobin: 14.2 g/dL (ref 13.0–17.0)
MCH: 29.5 pg (ref 26.0–34.0)
MCHC: 35.1 g/dL (ref 30.0–36.0)
MCV: 84 fL (ref 78.0–100.0)
Platelets: 362 10*3/uL (ref 150–400)
RBC: 4.81 MIL/uL (ref 4.22–5.81)
RDW: 13 % (ref 11.5–15.5)
WBC: 13.3 10*3/uL — ABNORMAL HIGH (ref 4.0–10.5)

## 2017-07-08 LAB — ETHANOL: Alcohol, Ethyl (B): <10 mg/dL (ref <10)

## 2017-07-08 LAB — ACETAMINOPHEN LEVEL

## 2017-07-08 LAB — SALICYLATE LEVEL

## 2017-07-08 MED ORDER — POTASSIUM CHLORIDE CRYS ER 20 MEQ PO TBCR
40.0000 meq | EXTENDED_RELEASE_TABLET | Freq: Once | ORAL | Status: AC
  Administered 2017-07-08: 40 meq via ORAL
  Filled 2017-07-08: qty 2

## 2017-07-08 MED ORDER — OLANZAPINE 10 MG PO TBDP
10.0000 mg | ORAL_TABLET | Freq: Once | ORAL | Status: AC
  Administered 2017-07-08: 10 mg via ORAL
  Filled 2017-07-08: qty 1

## 2017-07-08 NOTE — Patient Outreach (Signed)
ED Peer Support Specialist Patient Intake (Complete at intake & 30-60 Day Follow-up)  Name: Brandon Moyer  MRN: [id number]  Age: 27 y.o.   Date of Admission: 07/08/2017  Intake: Initial Comments:      Primary Reason Admitted: Completed everything needed.  Lab values: Alcohol/ETOH: Negative Positive UDS? Yes Amphetamines: Yes Barbiturates: No Benzodiazepines: No Cocaine: Yes Opiates: No Cannabinoids:    Demographic information: Gender: Male Ethnicity: Latino Marital Status: Single Insurance Status: Uninsured/Self-pay Ecologist (Work Neurosurgeon, Physicist, medical, etc.: No Lives with: Alone Living situation: House/Apartment  Reported Patient History: Patient reported health conditions:   Patient aware of HIV and hepatitis status: No  In past year, has patient visited ED for any reason? Yes  Number of ED visits:    Reason(s) for visit:    In past year, has patient been hospitalized for any reason? No  Number of hospitalizations:    Reason(s) for hospitalization:    In past year, has patient been arrested? Yes  Number of arrests:    Reason(s) for arrest:    In past year, has patient been incarcerated? No  Number of incarcerations:    Reason(s) for incarceration:    In past year, has patient received medication-assisted treatment? No  In past year, patient received the following treatments: Other (comment)  In past year, has patient received any harm reduction services? No  Did this include any of the following?    In past year, has patient received care from a mental health provider for diagnosis other than SUD? No  In past year, is this first time patient has overdosed?    Number of past overdoses:    In past year, is this first time patient has been hospitalized for an overdose?    Number of hospitalizations for overdose(s):    Is patient currently receiving treatment for a mental health diagnosis?    Patient reports  experiencing difficulty participating in SUD treatment:      Most important reason(s) for this difficulty?    Has patient received prior services for treatment?    In past, patient has received services from following agencies:    Plan of Care:  Suggested follow up at these agencies/treatment centers: [facility]), [facility]none at the time)  Other information: CPSS met with Pt and was able to prompt Pt to complete the series of questions. CPSS made Pt aware of visit and informed him that CPSS was only there to assist Pt as needed. CPSS was made aware that Pt did not want any services at the time an that he was ready to leave the ER. CPSS left contact information so that Pt would be able to contact CPSS if Pt wanted services outside the ED.    Aaron Edelman Bulah Lurie, Bri[place]  07/08/2017 4:24 PM

## 2017-07-08 NOTE — BH Assessment (Addendum)
Assessment Note  Brandon Moyer is an 27 y.o. male, who presents voluntary and unaccompanied to [facility]. Clinician asked the pt, "what brought you to the hospital?" Pt reported, "I was like running in my backyard behind my house from my probation officer." Pt reported, "I was trying to go right...left, in circles towards the house." Pt reported, "I had a vision of the police trying to kill me, like what the fuck." Pt reported, "I seen my dads truck and was like get me up out of here, I don't want to go to jail." Pt reported, "I seen the barrel of the gun, their uniforms." Pt reported, 2-3 months ago he was suicidal with a plan of overdosing on drugs and/or putting himself in risky situations. Pt reported, he cut himself seven years ago. Pt denies, SI, HI, self-injurious behaviors and access to weapons.   Pt was IVC'd by his father. Clinician attempted to contact pt's father to obtain collateral information however pt's father phone when straight to voicemail. Per IVC paperwork: "Respondent has been transported to the hospital by the petitioner because of hallucination and drug abuse. Petitioner stated the respondent called him and said if he did not come right away that he would kill someone. He believe police are chasing him. He is using cocaine and meth."    Pt denies abuse and current substance use. Pt's UDS is positive for amphetamines and marijuana. Pt reported, smoking marijuana 7-8 months ago. Pt denies, being linked to OPT resources (medication management and/or counseling.) Pt reported, previous inpatient admissions.   Pt presents quiet/awake in scrubs with logical/coherent speech. Pt's eye contact was fair. Pt's mood was anxious. Pt's affect was congruent with mood. Pt's judgement was partial. Pt was oriented x4. Pt's concentration was normal. Pt's insight and impulse control are fair. Pt reported, if discharged from [facility] he could contract for safety.   Diagnosis: F20.0 Paranoid schizophrenia  (HCC)                     F15.20 Amphetamine-type substance use disorder, severe.                    F12.20 Cannabis use Disorder, severe.   Past Medical History:  Past Medical History:  Diagnosis Date  . Allergy   . Anxiety   . Asthma   . Dvt femoral (deep venous thrombosis) (HCC)   . Seizures (HCC)     Past Surgical History:  Procedure Laterality Date  . Blood Clot Removal     Rt leg  . MULTIPLE TOOTH EXTRACTIONS      Family History:  Family History  Problem Relation Age of Onset  . Diabetes Mother     Social History:  reports that he has been smoking.  He has a 8.00 pack-year smoking history. He has never used smokeless tobacco. He reports that he drinks alcohol. He reports that he has current or past drug history. Drugs: Marijuana, Heroin, Methamphetamines, Cocaine, and IV.  Additional Social History:  Alcohol / Drug Use Pain Medications: See MAR  Prescriptions: See MAR Over the Counter: See MAR History of alcohol / drug use?: Yes Substance #1 Name of Substance 1: Amphetamines.  1 - Age of First Use: UTA 1 - Amount (size/oz): Pt's UDS is positive for amphetamines.  1 - Frequency: UTA 1 - Duration: UTA 1 - Last Use / Amount: UTA Substance #2 Name of Substance 2: Marijuana.  2 - Age of First Use: UTA 2 - Amount (size/oz):  Pt reported, "I used about 2-3 months ago." Pt's UDS is positive for marijuana. 2 - Frequency: UTA 2 - Duration: UTA 2 - Last Use / Amount: UTA  CIWA: CIWA-Ar BP: 134/78 Pulse Rate: (!) 115 COWS:    Allergies:  Allergies  Allergen Reactions  . Penicillins Hives    Has patient had a PCN reaction causing immediate rash, facial/tongue/throat swelling, SOB or lightheadedness with hypotension: Yes Has patient had a PCN reaction causing severe rash involving mucus membranes or skin necrosis: Yes Has patient had a PCN reaction that required hospitalization Yes Has patient had a PCN reaction occurring within the last 10 years: Yes If all of  the above answers are "NO", then may proceed with Cephalosporin use.      Home Medications:  (Not in a hospital admission)  OB/GYN Status:  No LMP for male patient.  General Assessment Data Location of Assessment: [facility] ED TTS Assessment: In system Is this a Tele or Face-to-Face Assessment?: Face-to-Face Is this an Initial Assessment or a Re-assessment for this encounter?: Initial Assessment Marital status: Single Living Arrangements: Alone Can pt return to current living arrangement?: Yes Admission Status: Involuntary Referral Source: Self/Family/Friend Insurance type: BCBS     Crisis Care Plan Living Arrangements: Alone Legal Guardian: Other:(Self. ) Name of Psychiatrist: NA Name of Therapist: NA  Education Status Is patient currently in school?: No Is the patient employed, unemployed or receiving disability?: Unemployed  Risk to self with the past 6 months Suicidal Ideation: No-Not Currently/Within Last 6 Months Has patient been a risk to self within the past 6 months prior to admission? : Yes Suicidal Intent: No-Not Currently/Within Last 6 Months Has patient had any suicidal intent within the past 6 months prior to admission? : Yes Is patient at risk for suicide?: No Suicidal Plan?: No-Not Currently/Within Last 6 Months Has patient had any suicidal plan within the past 6 months prior to admission? : Yes Access to Means: Yes Specify Access to Suicidal Means: Drugs.  What has been your use of drugs/alcohol within the last 12 months?: Amphetamines and marijuana.  Previous Attempts/Gestures: No How many times?: 0 Other Self Harm Risks: Cutting.  Triggers for Past Attempts: None known Intentional Self Injurious Behavior: Cutting Comment - Self Injurious Behavior: Pt reported, cutting himself seven years ago.  Family Suicide History: No Recent stressful life event(s): Other (Comment)(hallucinations. ) Persecutory voices/beliefs?: No Depression: No(Pt denies.  ) Depression Symptoms: (Pt denies. ) Substance abuse history and/or treatment for substance abuse?: Yes Suicide prevention information given to non-admitted patients: Not applicable  Risk to Others within the past 6 months Homicidal Ideation: Yes-Currently Present(Per IVC paperwork however pt denies. ) Does patient have any lifetime risk of violence toward others beyond the six months prior to admission? : No(Pt denies.) Thoughts of Harm to Others: Yes-Currently Present(Per IVC paperwork however pt denies. ) Comment - Thoughts of Harm to Others: Per IVC, pt told father if he did come rightnow he would kill someone.  Current Homicidal Intent: No Current Homicidal Plan: No Access to Homicidal Means: No(Pt denies. ) Identified Victim: NA History of harm to others?: No(Pt denies. ) Assessment of Violence: None Noted Violent Behavior Description: NA Does patient have access to weapons?: No(Pt denies. ) Criminal Charges Pending?: Yes Describe Pending Criminal Charges: Pt reported, possession charges, DWI and armed robbery.  Does patient have a court date: (UTA) Is patient on probation?: Yes  Psychosis Hallucinations: Auditory, Visual Delusions: Unspecified  Mental Status Report Appearance/Hygiene: In scrubs Eye Contact:  Fair Motor Activity: Freedom of movement, Unremarkable Speech: Logical/coherent Level of Consciousness: Quiet/awake Mood: Anxious Affect: Other (Comment) Anxiety Level: Moderate Thought Processes: Coherent, Relevant Judgement: Partial Orientation: Person, Place, Time, Situation Obsessive Compulsive Thoughts/Behaviors: None  Cognitive Functioning Concentration: Normal Memory: Recent Intact Is patient IDD: No Is patient DD?: No Insight: Fair Impulse Control: Fair Appetite: Good Sleep: Decreased Total Hours of Sleep: (Pt reported, not sleeping in five days. ) Vegetative Symptoms: Staying in bed  ADLScreening [facility] Assessment Services) Patient's cognitive  ability adequate to safely complete daily activities?: Yes Patient able to express need for assistance with ADLs?: Yes Independently performs ADLs?: Yes (appropriate for developmental age)  Prior Inpatient Therapy Prior Inpatient Therapy: Yes Prior Therapy Dates: UTA Prior Therapy Facilty/Provider(s): UTA Reason for Treatment: AVH  Prior Outpatient Therapy Prior Outpatient Therapy: No Does patient have an ACCT team?: No Does patient have Intensive In-House Services?  : No Does patient have [facility] services? : No Does patient have P4CC services?: No  ADL Screening (condition at time of admission) Patient's cognitive ability adequate to safely complete daily activities?: Yes Is the patient deaf or have difficulty hearing?: No Does the patient have difficulty seeing, even when wearing glasses/contacts?: No Does the patient have difficulty concentrating, remembering, or making decisions?: Yes Patient able to express need for assistance with ADLs?: Yes Does the patient have difficulty dressing or bathing?: No Independently performs ADLs?: Yes (appropriate for developmental age) Does the patient have difficulty walking or climbing stairs?: No Weakness of Legs: None Weakness of Arms/Hands: None  Home Assistive Devices/Equipment Home Assistive Devices/Equipment: None    Abuse/Neglect Assessment (Assessment to be complete while patient is alone) Abuse/Neglect Assessment Can Be Completed: Yes Physical Abuse: Denies(Pt denies. ) Verbal Abuse: Denies(Pt denies. ) Sexual Abuse: Denies(Pt denies. ) Exploitation of patient/patient's resources: Denies(Pt denies.) Self-Neglect: Denies(Pt denies. )     Advance Directives (For Healthcare) Does Patient Have a Medical Advance Directive?: No    Additional Information 1:1 In Past 12 Months?: No CIRT Risk: No Elopement Risk: No Does patient have medical clearance?: Yes     Disposition: Nira Conn, NP recommends inpatient treatment.  Disposition discussed with Dr. Bebe Shaggy and Darel Hong, RN. TTS to seek placement.   Disposition Initial Assessment Completed for this Encounter: Yes Disposition of Patient: (inpatient treatment.) Patient refused recommended treatment: No Mode of transportation if patient is discharged?: N/A  On Site Evaluation by:  Holly Bodily. Lazer Wollard, MS, LPC, CRC. Reviewed with Physician:  Dr. Bebe Shaggy and Nira Conn, NP.  Redmond Pulling 07/08/2017 3:00 AM   Redmond Pulling, MS, [facility], [facility] Triage Specialist [phone]
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
## 2017-07-08 NOTE — ED Notes (Signed)
Pt visualized standing in the hallway talking to imaginary people.

## 2017-07-08 NOTE — ED Notes (Addendum)
Pt has one white pt belonging's bag with clothes and shoes placed under the water fountain in triage.

## 2017-07-08 NOTE — Discharge Instructions (Signed)
For your behavioral health needs, you are advised to follow up with one of the following providers.  Both provide psychiatry, therapy, and substance abuse treatment.  Contact them at your earliest opportunity to ask about scheduling an intake appointment:       [facility]      [address]      [place], [place] [address]      [phone]       [facility]      [address]      [place], [place] [address]      [phone]

## 2017-07-08 NOTE — ED Notes (Signed)
Pt assessment is difficult because of pt thought blocking and appears to have difficulty understanding the questions. He has been in bed calm and cooperative at this time. Reports living with his girlfriend, but does not know how that relationship is going at this time. Admits to using cocaine and meth.

## 2017-07-08 NOTE — ED Notes (Signed)
SBAR Report received from previous nurse. Pt received calm and visible on unit. Pt denies current SI/ HI, depression, anxiety, or pain at this time, and appears otherwise stable and free of distress. Pt endorses auditory and visual hallucinations which tell him things and sometimes tell him to do things. Pt seen by writer standing in chair talking to ceiling, Clinical research associatewriter reality oriented pt and asked if he would take medication to help him sleep and stop the voices, to which he agreed.  Pt reminded of camera surveillance, q 15 min rounds, and rules of the milieu pt provided snack and beverage and comfort addressed. Will continue to assess.

## 2017-07-08 NOTE — ED Notes (Signed)
Pt slept all day and woke up more alert and able to focus to understand questions and give appropriate answers. He requested to be discharged and pt was discharged. All belongings returned to pt who signed for same. He denied SI/HI/AVH and did not appear to be delusional.

## 2017-07-08 NOTE — ED Notes (Signed)
Pt's father Mr. Burman FreestoneSalvador Bovey [phone] would like to be kept updated on pt's progress.

## 2017-07-08 NOTE — ED Notes (Signed)
[facility] Expected date:  Expected time:  Means of arrival:  Comments: [place]

## 2017-07-08 NOTE — BH Assessment (Addendum)
[facility]  Per Juanetta BeetsJacqueline Norman, DO, [facility] and stabilization.  Pt presents under IVC initiated by pt's father, however, Dr Sharma CovertNorman finds that pt does not meet criteria for IVC, and she has rescinded it.  Pt would benefit from seeing [facility]; they will be asked to speak to pt.  Pt's nurse, Diane, has been notified.  Doylene Canninghomas Kramer Hanrahan, [place] Behavioral Health Coordinator [phone]   Addendum:  As of 15:20 pt is asking to be discharged.  Discharge instructions include referral information for [facility], and for [facility], specifying that both providers offer psychiatry, therapy and substance abuse treatment.  Arlys JohnBrian with [facility] has been notified, as has Diane.  Doylene Canninghomas Tobin Wit[place]i, [place] Behavioral Health Coordinator [phone]

## 2017-07-08 NOTE — ED Notes (Signed)
Bed: [facility] Expected date:  Expected time:  Means of arrival:  Comments: 

## 2017-12-14 ENCOUNTER — Other Ambulatory Visit: Payer: Self-pay

## 2017-12-14 ENCOUNTER — Encounter (HOSPITAL_COMMUNITY): Payer: Self-pay

## 2017-12-14 ENCOUNTER — Observation Stay (HOSPITAL_COMMUNITY)
Admission: EM | Admit: 2017-12-14 | Discharge: 2017-12-15 | Discharge status: Against Medical Advice | Payer: Self-pay | Attending: Internal Medicine | Admitting: Internal Medicine

## 2017-12-14 DIAGNOSIS — J45909 Unspecified asthma, uncomplicated: Secondary | ICD-10-CM | POA: Insufficient documentation

## 2017-12-14 DIAGNOSIS — T424X4A Poisoning by benzodiazepines, undetermined, initial encounter: Principal | ICD-10-CM | POA: Diagnosis present

## 2017-12-14 DIAGNOSIS — F200 Paranoid schizophrenia: Secondary | ICD-10-CM | POA: Diagnosis present

## 2017-12-14 DIAGNOSIS — G92 Toxic encephalopathy: Secondary | ICD-10-CM | POA: Insufficient documentation

## 2017-12-14 DIAGNOSIS — Y929 Unspecified place or not applicable: Secondary | ICD-10-CM | POA: Insufficient documentation

## 2017-12-14 DIAGNOSIS — G930 Cerebral cysts: Secondary | ICD-10-CM | POA: Insufficient documentation

## 2017-12-14 DIAGNOSIS — F17210 Nicotine dependence, cigarettes, uncomplicated: Secondary | ICD-10-CM | POA: Insufficient documentation

## 2017-12-14 DIAGNOSIS — F1910 Other psychoactive substance abuse, uncomplicated: Secondary | ICD-10-CM | POA: Diagnosis present

## 2017-12-14 DIAGNOSIS — Z5321 Procedure and treatment not carried out due to patient leaving prior to being seen by health care provider: Secondary | ICD-10-CM | POA: Insufficient documentation

## 2017-12-14 DIAGNOSIS — F419 Anxiety disorder, unspecified: Secondary | ICD-10-CM | POA: Insufficient documentation

## 2017-12-14 DIAGNOSIS — Z880 Allergy status to penicillin: Secondary | ICD-10-CM | POA: Insufficient documentation

## 2017-12-14 DIAGNOSIS — Z86718 Personal history of other venous thrombosis and embolism: Secondary | ICD-10-CM | POA: Insufficient documentation

## 2017-12-14 DIAGNOSIS — Z79899 Other long term (current) drug therapy: Secondary | ICD-10-CM | POA: Insufficient documentation

## 2017-12-14 LAB — URINALYSIS, ROUTINE W REFLEX MICROSCOPIC
BILIRUBIN URINE: NEGATIVE
Bacteria, UA: NONE SEEN
Glucose, UA: NEGATIVE mg/dL
Hgb urine dipstick: NEGATIVE
Ketones, ur: NEGATIVE mg/dL
Leukocytes, UA: NEGATIVE
NITRITE: NEGATIVE
Protein, ur: 30 mg/dL — AB
SPECIFIC GRAVITY, URINE: 1.014 (ref 1.005–1.030)
pH: 6 (ref 5.0–8.0)

## 2017-12-14 LAB — COMPREHENSIVE METABOLIC PANEL
ALK PHOS: 70 U/L (ref 38–126)
ALT: 19 U/L (ref 0–44)
AST: 33 U/L (ref 15–41)
Albumin: 4.1 g/dL (ref 3.5–5.0)
Anion gap: 13 (ref 5–15)
BILIRUBIN TOTAL: 1 mg/dL (ref 0.3–1.2)
BUN: 9 mg/dL (ref 6–20)
CO2: 27 mmol/L (ref 22–32)
CREATININE: 1.16 mg/dL (ref 0.61–1.24)
Calcium: 9.8 mg/dL (ref 8.9–10.3)
Chloride: 99 mmol/L (ref 98–111)
GFR calc Af Amer: >60 mL/min (ref >60)
Glucose, Bld: 101 mg/dL — ABNORMAL HIGH (ref 70–99)
Potassium: 4.2 mmol/L (ref 3.5–5.1)
Sodium: 139 mmol/L (ref 135–145)
TOTAL PROTEIN: 7.7 g/dL (ref 6.5–8.1)

## 2017-12-14 LAB — CBC
HEMATOCRIT: 45.6 % (ref 39.0–52.0)
HEMOGLOBIN: 16.3 g/dL (ref 13.0–17.0)
MCH: 29.9 pg (ref 26.0–34.0)
MCHC: 35.7 g/dL (ref 30.0–36.0)
MCV: 83.7 fL (ref 78.0–100.0)
Platelets: 394 10*3/uL (ref 150–400)
RBC: 5.45 MIL/uL (ref 4.22–5.81)
RDW: 12.6 % (ref 11.5–15.5)
WBC: 10.5 10*3/uL (ref 4.0–10.5)

## 2017-12-14 LAB — RAPID URINE DRUG SCREEN, HOSP PERFORMED
AMPHETAMINES: POSITIVE — AB
BARBITURATES: NOT DETECTED
Benzodiazepines: NOT DETECTED
COCAINE: NOT DETECTED
Opiates: NOT DETECTED
TETRAHYDROCANNABINOL: POSITIVE — AB

## 2017-12-14 LAB — ETHANOL

## 2017-12-14 LAB — SALICYLATE LEVEL: Salicylate Lvl: <7 mg/dL (ref 2.8–30.0)

## 2017-12-14 LAB — CARBAMAZEPINE LEVEL, TOTAL

## 2017-12-14 LAB — ACETAMINOPHEN LEVEL: Acetaminophen (Tylenol), Serum: <10 ug/mL — ABNORMAL LOW (ref 10–30)

## 2017-12-14 LAB — CBG MONITORING, ED: Glucose-Capillary: 104 mg/dL — ABNORMAL HIGH (ref 70–99)

## 2017-12-14 MED ORDER — ACETAMINOPHEN 650 MG RE SUPP: 650.0000 mg | Freq: Four times a day (QID) | RECTAL | Status: DC | PRN

## 2017-12-14 MED ORDER — SODIUM CHLORIDE 0.9% FLUSH
3.0000 mL | Freq: Two times a day (BID) | INTRAVENOUS | Status: DC
  Administered 2017-12-14: 3 mL via INTRAVENOUS

## 2017-12-14 MED ORDER — ACETAMINOPHEN 325 MG PO TABS: 650.0000 mg | ORAL_TABLET | Freq: Four times a day (QID) | ORAL | Status: DC | PRN

## 2017-12-14 MED ORDER — LORAZEPAM 2 MG/ML IJ SOLN
1.0000 mg | Freq: Four times a day (QID) | INTRAMUSCULAR | Status: DC | PRN
  Administered 2017-12-14: 1 mg via INTRAVENOUS
  Filled 2017-12-14: qty 1

## 2017-12-14 MED ORDER — LORAZEPAM 2 MG/ML IJ SOLN
0.0000 mg | Freq: Four times a day (QID) | INTRAMUSCULAR | Status: DC
  Administered 2017-12-15: 2 mg via INTRAVENOUS
  Filled 2017-12-14 (×2): qty 1

## 2017-12-14 MED ORDER — ENOXAPARIN SODIUM 40 MG/0.4ML ~~LOC~~ SOLN
40.0000 mg | SUBCUTANEOUS | Status: DC
  Administered 2017-12-14: 40 mg via SUBCUTANEOUS
  Filled 2017-12-14: qty 0.4

## 2017-12-14 MED ORDER — ADULT MULTIVITAMIN W/MINERALS CH
1.0000 | ORAL_TABLET | Freq: Every day | ORAL | Status: DC
  Administered 2017-12-14 – 2017-12-15 (×2): 1 via ORAL
  Filled 2017-12-14 (×2): qty 1

## 2017-12-14 MED ORDER — ONDANSETRON HCL 4 MG PO TABS: 4.0000 mg | ORAL_TABLET | Freq: Four times a day (QID) | ORAL | Status: DC | PRN

## 2017-12-14 MED ORDER — IBUPROFEN 200 MG PO TABS
600.0000 mg | ORAL_TABLET | Freq: Once | ORAL | Status: AC
  Administered 2017-12-14: 600 mg via ORAL
  Filled 2017-12-14: qty 3

## 2017-12-14 MED ORDER — VITAMIN B-1 100 MG PO TABS
100.0000 mg | ORAL_TABLET | Freq: Every day | ORAL | Status: DC
  Administered 2017-12-14 – 2017-12-15 (×2): 100 mg via ORAL
  Filled 2017-12-14 (×2): qty 1

## 2017-12-14 MED ORDER — SODIUM CHLORIDE 0.9 % IV BOLUS
1000.0000 mL | Freq: Once | INTRAVENOUS | Status: AC
  Administered 2017-12-14: 1000 mL via INTRAVENOUS

## 2017-12-14 MED ORDER — ONDANSETRON HCL 4 MG/2ML IJ SOLN
4.0000 mg | Freq: Four times a day (QID) | INTRAMUSCULAR | Status: DC | PRN
  Administered 2017-12-14 – 2017-12-15 (×2): 4 mg via INTRAVENOUS
  Filled 2017-12-14 (×2): qty 2

## 2017-12-14 MED ORDER — SENNOSIDES-DOCUSATE SODIUM 8.6-50 MG PO TABS: 1.0000 | ORAL_TABLET | Freq: Every evening | ORAL | Status: DC | PRN

## 2017-12-14 MED ORDER — LORAZEPAM 2 MG/ML IJ SOLN: 0.0000 mg | Freq: Two times a day (BID) | INTRAMUSCULAR | Status: DC

## 2017-12-14 MED ORDER — SODIUM CHLORIDE 0.9 % IV SOLN
INTRAVENOUS | Status: DC
  Administered 2017-12-15: 04:00:00 via INTRAVENOUS
  Administered 2017-12-15: 1000 mL via INTRAVENOUS

## 2017-12-14 MED ORDER — FOLIC ACID 1 MG PO TABS
1.0000 mg | ORAL_TABLET | Freq: Every day | ORAL | Status: DC
  Administered 2017-12-14 – 2017-12-15 (×2): 1 mg via ORAL
  Filled 2017-12-14 (×2): qty 1

## 2017-12-14 MED ORDER — THIAMINE HCL 100 MG/ML IJ SOLN: 100.0000 mg | Freq: Every day | INTRAMUSCULAR | Status: DC

## 2017-12-14 MED ORDER — LORAZEPAM 1 MG PO TABS: 1.0000 mg | ORAL_TABLET | Freq: Four times a day (QID) | ORAL | Status: DC | PRN

## 2017-12-14 NOTE — ED Notes (Signed)
ED TO INPATIENT HANDOFF REPORT  Name/Age/Gender Brandon Moyer 27 y.o. male  Code Status Code Status History    Date Active Date Inactive Code Status Order ID Comments User Context   07/08/2017 0153 07/08/2017 1956 Full Code [id number]  Delaine Lame ED   01/23/2017 2127 01/28/2017 2053 Full Code [id number]  Ethelene Hal, NP Inpatient   08/30/2014 1217 08/31/2014 1428 Full Code [id number]  Lisette Abu, PA-C Inpatient      Home/SNF/Other Home  Chief Complaint Overdose Brandon Moyer  Level of Care/Admitting Diagnosis ED Disposition    ED Disposition Condition [facility]: [facility] [[id number]]  Level of Care: Telemetry [5]  Admit to tele based on following criteria: Complex arrhythmia (Bradycardia/Tachycardia)  Admit to tele based on following criteria: Monitor QTC interval  Diagnosis: Benzodiazepine overdose of undetermined intent [937902]  Admitting Physician: Alma Friendly [[id number]]  Attending Physician: Alma Friendly [[id number]]  PT Class (Do Not Modify): Observation [104]  PT Acc Code (Do Not Modify): Observation [10022]       Medical History Past Medical History:  Diagnosis Date  . Allergy   . Anxiety   . Asthma   . Dvt femoral (deep venous thrombosis) ([place])   . Seizures (HCC)     Allergies Allergies  Allergen Reactions  . Penicillins Hives    Has patient had a PCN reaction causing immediate rash, facial/tongue/throat swelling, SOB or lightheadedness with hypotension: Yes Has patient had a PCN reaction causing severe rash involving mucus membranes or skin necrosis: Yes Has patient had a PCN reaction that required hospitalization Yes Has patient had a PCN reaction occurring within the last 10 years: Yes If all of the above answers are "NO", then may proceed with Cephalosporin use.      IV Location/Drains/Wounds Patient Lines/Drains/Airways Status   Active Line/Drains/Airways    Name:    Placement date:   Placement time:   Site:   Days:   Peripheral IV 12/14/17 Left;Upper Forearm   12/14/17    1112    Forearm   less than 1          Labs/Imaging Results for orders placed or performed during the hospital encounter of 12/14/17 (from the past 48 hour(s))  CBC     Status: None   Collection Time: 12/14/17 11:37 AM  Result Value Ref Range   WBC 10.5 4.0 - 10.5 K/uL   RBC 5.45 4.22 - 5.81 MIL/uL   Hemoglobin 16.3 13.0 - 17.0 g/dL   HCT 45.6 39.0 - 52.0 %   MCV 83.7 78.0 - 100.0 fL   MCH 29.9 26.0 - 34.0 pg   MCHC 35.7 30.0 - 36.0 g/dL   RDW 12.6 11.5 - 15.5 %   Platelets 394 150 - 400 K/uL    Comment: Performed at [facility], [place] [address], [place], [place] [address]  Comprehensive metabolic panel     Status: Abnormal   Collection Time: 12/14/17 11:37 AM  Result Value Ref Range   Sodium 139 135 - 145 mmol/L   Potassium 4.2 3.5 - 5.1 mmol/L   Chloride 99 98 - 111 mmol/L   CO2 27 22 - 32 mmol/L   Glucose, Bld 101 (H) 70 - 99 mg/dL   BUN 9 6 - 20 mg/dL   Creatinine, Ser 1.16 0.61 - 1.24 mg/dL   Calcium 9.8 8.9 - 10.3 mg/dL   Total Protein 7.7 6.5 - 8.1 g/dL   Albumin 4.1 3.5 -  5.0 g/dL   AST 33 15 - 41 U/L   ALT 19 0 - 44 U/L   Alkaline Phosphatase 70 38 - 126 U/L   Total Bilirubin 1.0 0.3 - 1.2 mg/dL   GFR calc non Af Amer >60 >60 mL/min   GFR calc Af Amer >60 >60 mL/min    Comment: (NOTE) The eGFR has been calculated using the CKD EPI equation. This calculation has not been validated in all clinical situations. eGFR's persistently <60 mL/min signify possible Chronic Kidney Disease.    Anion gap 13 5 - 15    Comment: Performed at [facility], [place] [address] [place], [place] [address]  Ethanol     Status: None   Collection Time: 12/14/17 11:37 AM  Result Value Ref Range   Alcohol, Ethyl (B) <10 <10 mg/dL    Comment: (NOTE) Lowest detectable limit for serum alcohol is 10 mg/dL. For medical purposes only. Performed  at [facility], [place] [address], [place], [place] [address]   Acetaminophen level     Status: Abnormal   Collection Time: 12/14/17 11:37 AM  Result Value Ref Range   Acetaminophen (Tylenol), Serum <10 (L) 10 - 30 ug/mL    Comment: (NOTE) Therapeutic concentrations vary significantly. A range of 10-30 ug/mL  may be an effective concentration for many patients. However, some  are best treated at concentrations outside of this range. Acetaminophen concentrations >150 ug/mL at 4 hours after ingestion  and >50 ug/mL at 12 hours after ingestion are often associated with  toxic reactions. Performed at [facility], [place] [address], [place], [place] [address]   Salicylate level     Status: None   Collection Time: 12/14/17 11:37 AM  Result Value Ref Range   Salicylate Lvl <7.4 2.8 - 30.0 mg/dL    Comment: Performed at [facility], [place] [address] [place], [place] [address]  CBG monitoring, ED     Status: Abnormal   Collection Time: 12/14/17 11:43 AM  Result Value Ref Range   Glucose-Capillary 104 (H) 70 - 99 mg/dL   No results found.  Pending Labs Unresulted Labs (From admission, onward)    Start     Ordered   12/14/17 1454  Carbamazepine level, total  STAT,   STAT     12/14/17 1453   12/14/17 1128  Rapid urine drug screen (hospital performed)  STAT,   R     12/14/17 1127   Signed and Held  HIV antibody (Routine Testing)  Once,   R     Signed and Held   Signed and Held  Comprehensive metabolic panel  Tomorrow morning,   R     Signed and Held   Signed and Held  Basic metabolic panel  Tomorrow morning,   R     Signed and Held          Vitals/Pain Today's Vitals   12/14/17 1545 12/14/17 1600 12/14/17 1615 12/14/17 1630  BP: 126/79 126/79 123/80 116/74  Pulse:  85  81  Resp: 12 (!) 24 14 12   Temp:      TempSrc:      SpO2: 99% 100% 100% 100%  Weight:      Height:      PainSc:        Isolation Precautions No  active isolations  Medications Medications  LORazepam (ATIVAN) tablet 1 mg (has no administration in time range)    Or  LORazepam (ATIVAN) injection 1 mg (  has no administration in time range)  thiamine (VITAMIN B-1) tablet 100 mg (has no administration in time range)    Or  thiamine (B-1) injection 100 mg (has no administration in time range)  folic acid (FOLVITE) tablet 1 mg (has no administration in time range)  multivitamin with minerals tablet 1 tablet (has no administration in time range)  LORazepam (ATIVAN) injection 0-4 mg (has no administration in time range)    Followed by  LORazepam (ATIVAN) injection 0-4 mg (has no administration in time range)  sodium chloride 0.9 % bolus 1,000 mL (0 mLs Intravenous Stopped 12/14/17 1319)  ibuprofen (ADVIL,MOTRIN) tablet 600 mg (600 mg Oral Given 12/14/17 1250)    Mobility walks

## 2017-12-14 NOTE — ED Triage Notes (Signed)
Pt arrived via [facility] with [employer] escort. Pt is in [employer] custody. Civilian called 911 about pt being uncounsious and aggressive. Police showed up on scene, pt had "baggy" of drugs, that was white in color. Pt was resisting arrest at first and is now calm. Pt is AOx2 and ambulatory at baseline.  [employer] at bedside.

## 2017-12-14 NOTE — ED Provider Notes (Signed)
[facility] Emergency Department Provider Note MRN:  [id number]  Arrival date & time: 12/14/17     Chief Complaint   Drug Overdose   History of Present Illness   Brandon Moyer is a 27 y.o. year-old male with a history of anxiety presenting to the ED with chief complaint of drug overdose.  Patient arriving in police custody, per report was found by police with "white substance" in his hand.  Upon seeing the police, he fleets seen.  He was detained by police, brought to the ground.  Police deny any significant trauma, no head trauma.  Patient was altered, somnolent upon arrival.  Patient denies any significant pain at this time.  Admits to Xanax use today.  Denies any other substances.  I was unable to obtain an accurate HPI, PMH, or ROS due to the patient's altered mental status.  Review of Systems  Positive for drug overdose, altered mental status.  Patient's Health History    Past Medical History:  Diagnosis Date  . Allergy   . Anxiety   . Asthma   . Dvt femoral (deep venous thrombosis) (HCC)   . Seizures (HCC)     Past Surgical History:  Procedure Laterality Date  . Blood Clot Removal     Rt leg  . MULTIPLE TOOTH EXTRACTIONS      Family History  Problem Relation Age of Onset  . Diabetes Mother     Social History   Socioeconomic History  . Marital status: Single    Spouse name: Not on file  . Number of children: Not on file  . Years of education: Not on file  . Highest education level: Not on file  Occupational History  . Not on file  Social Needs  . Financial resource strain: Not on file  . Food insecurity:    Worry: Not on file    Inability: Not on file  . Transportation needs:    Medical: Not on file    Non-medical: Not on file  Tobacco Use  . Smoking status: Current Every Day Smoker    Packs/day: 0.80    Years: 10.00    Pack years: 8.00  . Smokeless tobacco: Never Used  Substance and Sexual Activity  . Alcohol use: Yes   Comment: socially  . Drug use: Yes    Types: Marijuana, Heroin, Methamphetamines, Cocaine, IV    Comment: Benzo's,   . Sexual activity: Not Currently    Birth control/protection: None  Lifestyle  . Physical activity:    Days per week: Not on file    Minutes per session: Not on file  . Stress: Not on file  Relationships  . Social connections:    Talks on phone: Not on file    Gets together: Not on file    Attends religious service: Not on file    Active member of club or organization: Not on file    Attends meetings of clubs or organizations: Not on file    Relationship status: Not on file  . Intimate partner violence:    Fear of current or ex partner: Not on file    Emotionally abused: Not on file    Physically abused: Not on file    Forced sexual activity: Not on file  Other Topics Concern  . Not on file  Social History Narrative  . Not on file     Physical Exam  Vital Signs and Nursing Notes reviewed Vitals:   12/14/17 1530 12/14/17 1600  BP:  115/79 126/79  Pulse: 95 85  Resp: 16 (!) 24  Temp:    SpO2: 100% 100%    CONSTITUTIONAL: Well-appearing, NAD NEURO: Somnolent but wakes to voice, moving all extremities, following commands, intermittently conversant EYES:  eyes equal and reactive ENT/NECK:  no LAD, no JVD CARDIO: Regular rate, well-perfused, normal S1 and S2 PULM:  CTAB no wheezing or rhonchi GI/GU:  normal bowel sounds, non-distended, non-tender MSK/SPINE:  No gross deformities, no edema SKIN:  no rash, atraumatic PSYCH:  Appropriate speech and behavior  Diagnostic and Interventional Summary    EKG Interpretation  Date/Time:    Ventricular Rate:    PR Interval:    QRS Duration:   QT Interval:    QTC Calculation:   R Axis:     Text Interpretation:        Labs Reviewed  COMPREHENSIVE METABOLIC PANEL - Abnormal; Notable for the following components:      Result Value   Glucose, Bld 101 (*)    All other components within normal limits    ACETAMINOPHEN LEVEL - Abnormal; Notable for the following components:   Acetaminophen (Tylenol), Serum <10 (*)    All other components within normal limits  CBG MONITORING, ED - Abnormal; Notable for the following components:   Glucose-Capillary 104 (*)    All other components within normal limits  CBC  ETHANOL  SALICYLATE LEVEL  RAPID URINE DRUG SCREEN, HOSP PERFORMED  CARBAMAZEPINE LEVEL, TOTAL    No orders to display    Medications  LORazepam (ATIVAN) tablet 1 mg (has no administration in time range)    Or  LORazepam (ATIVAN) injection 1 mg (has no administration in time range)  thiamine (VITAMIN B-1) tablet 100 mg (has no administration in time range)    Or  thiamine (B-1) injection 100 mg (has no administration in time range)  folic acid (FOLVITE) tablet 1 mg (has no administration in time range)  multivitamin with minerals tablet 1 tablet (has no administration in time range)  LORazepam (ATIVAN) injection 0-4 mg (has no administration in time range)    Followed by  LORazepam (ATIVAN) injection 0-4 mg (has no administration in time range)  sodium chloride 0.9 % bolus 1,000 mL (0 mLs Intravenous Stopped 12/14/17 1319)  ibuprofen (ADVIL,MOTRIN) tablet 600 mg (600 mg Oral Given 12/14/17 1250)     Procedures Critical Care Critical Care Documentation Critical care time provided by me (excluding procedures): 35 minutes  Condition necessitating critical care: Drug overdose, benzodiazepine overdose  Components of critical care management: reviewing of prior records, laboratory and imaging interpretation, frequent re-examination and reassessment of vital signs, administration of IV fluid resuscitation, discussion with consulting services    ED Course and Medical Decision Making  I have reviewed the triage vital signs and the nursing notes.  Pertinent labs & imaging results that were available during my care of the patient were reviewed by me and considered in my medical decision  making (see below for details).    Concern for drug overdose in this 27 year old male, admits to Xanax today.  No other significant toxidrome occurred findings on exam.  Close observation, labs pending.  No significant evidence of trauma.  Continued somnolence after 3+ hours the emergency department, admitted to hospitalist service for further observation and medical clearance/metabolization of benzodiazepine overdose.  Elmer Sow. Pilar Plate, MD [facility] Emergency Medicine [facility] [email or web address]  Final Clinical Impressions(s) / ED Diagnoses     ICD-10-CM   1. Benzodiazepine overdose of undetermined intent,  initial encounter T42.4X4A     ED Discharge Orders    None         Sabas Sous, MD 12/14/17 1623

## 2017-12-14 NOTE — H&P (Addendum)
History and Physical  Brandon Moyer ZOX:[id number] DOB: 1990/12/21 DOA: 12/14/2017   PCP: Patient, No Pcp Per  Patient coming from: Home & is able to ambulate independently  Chief Complaint: Drug overdose  HPI: Brandon Moyer is a 27 y.o. male with medical history significant for polysubstance abuse, history of seizures, was brought into the ED by [facility] Department, after after patient was found by police to have a white substance in his hand.  Patient was detained by police and brought to the ER for further management patient was noted to be altered/somnolent.  Patient was able to admit to Xanax use, unable to quantify.  Denies any other current substance abuse, any chest pain, shortness of breath, abdominal pain, fever/chills, nausea/vomiting/diarrhea/constipation.  Unable to get a detailed history from patient as patient was altered.  History mostly gotten from chart review.  ED Course: Patient's vital signs noted to be stable, labs WNL, Tylenol, salicylate level within normal limits.  Due to patient being altered, and admitted for observation  Review of Systems: Review of systems are otherwise negative   Past Medical History:  Diagnosis Date  . Allergy   . Anxiety   . Asthma   . Dvt femoral (deep venous thrombosis) (HCC)   . Seizures (HCC)    Past Surgical History:  Procedure Laterality Date  . Blood Clot Removal     Rt leg  . MULTIPLE TOOTH EXTRACTIONS      Social History:  reports that he has been smoking. He has a 8.00 pack-year smoking history. He has never used smokeless tobacco. He reports that he drinks alcohol. He reports that he has current or past drug history. Drugs: Marijuana, Heroin, Methamphetamines, Cocaine, and IV.   Allergies  Allergen Reactions  . Penicillins Hives    Has patient had a PCN reaction causing immediate rash, facial/tongue/throat swelling, SOB or lightheadedness with hypotension: Yes Has patient had a PCN reaction causing severe  rash involving mucus membranes or skin necrosis: Yes Has patient had a PCN reaction that required hospitalization Yes Has patient had a PCN reaction occurring within the last 10 years: Yes If all of the above answers are "NO", then may proceed with Cephalosporin use.      Family History  Problem Relation Age of Onset  . Diabetes Mother       Prior to Admission medications   Medication Sig Start Date End Date Taking? Authorizing Provider  asenapine (SAPHRIS) 5 MG SUBL 24 hr tablet Place 2 tablets (10 mg total) 2 (two) times daily under the tongue. Patient not taking: Reported on 07/08/2017 02/23/17   Charm Rings, NP  carbamazepine (TEGRETOL XR) 200 MG 12 hr tablet Take 1 tablet (200 mg total) 2 (two) times daily by mouth. Patient not taking: Reported on 07/08/2017 02/23/17   Charm Rings, NP  gabapentin (NEURONTIN) 300 MG capsule Take 1 capsule (300 mg total) 3 (three) times daily by mouth. Patient not taking: Reported on 07/08/2017 02/23/17   Charm Rings, NP  traZODone (DESYREL) 50 MG tablet Take 1 tablet (50 mg total) at bedtime as needed by mouth for sleep. Patient not taking: Reported on 07/08/2017 02/23/17   Charm Rings, NP    Physical Exam: BP 126/79 (BP Location: Right Arm)   Pulse 77   Temp 98 F (36.7 C) (Oral)   Resp 14   Ht 5\' 9"  (1.753 m)   Wt 81 kg   SpO2 100% Comment: 100  BMI 26.37 kg/m   General:  Drowsy, not oriented Eyes: Normal ENT: Normal Neck: Supple Cardiovascular: S1, S2 present Respiratory: CTA B Abdomen: Soft, nontender, nondistended, bowel sounds present Skin: Normal, multiple tattoos noted Musculoskeletal: No pedal edema bilaterally Psychiatric: Unable to assess Neurologic: No focal neurologic deficit noted          Labs on Admission:  Basic Metabolic Panel: Recent Labs  Lab 12/14/17 1137  NA 139  K 4.2  CL 99  CO2 27  GLUCOSE 101*  BUN 9  CREATININE 1.16  CALCIUM 9.8   Liver Function Tests: Recent Labs  Lab  12/14/17 1137  AST 33  ALT 19  ALKPHOS 70  BILITOT 1.0  PROT 7.7  ALBUMIN 4.1   No results for input(s): LIPASE, AMYLASE in the last 168 hours. No results for input(s): AMMONIA in the last 168 hours. CBC: Recent Labs  Lab 12/14/17 1137  WBC 10.5  HGB 16.3  HCT 45.6  MCV 83.7  PLT 394   Cardiac Enzymes: No results for input(s): CKTOTAL, CKMB, CKMBINDEX, TROPONINI in the last 168 hours.  BNP (last 3 results) No results for input(s): BNP in the last 8760 hours.  ProBNP (last 3 results) No results for input(s): PROBNP in the last 8760 hours.  CBG: Recent Labs  Lab 12/14/17 1143  GLUCAP 104*    Radiological Exams on Admission: No results found.  EKG: Independently reviewed  Assessment/Plan Present on Admission: . Benzodiazepine overdose of undetermined intent . Polysubstance abuse (HCC) . Paranoid schizophrenia (HCC)  Principal Problem:   Benzodiazepine overdose of undetermined intent Active Problems:   Polysubstance abuse (HCC)   Paranoid schizophrenia (HCC)  Acute metabolic encephalopathy Likely 2/2 to polysubstance abuse/overdose Afebrile, no leukocytosis All labs unremarkable CT head pending Monitor closely  Polysubstance abuse/overdose Reported taking Xanax, unknown quantity History of marijuana, heroine, cocaine, amphetamines abuse UA, UDS pending CIWA protocol IV fluids Monitor on telemetry  ?Hx of seizure Patient not currently taking carbamazepine, gabapentin Carbamazepine level low Seizure precautions, CIWA protocol   DVT prophylaxis: Lovenox  Code Status: Full  Family Communication: None at bedside  Disposition Plan: Pending [facility] Department  Consults called: None  Admission status: Observation    Briant Cedar MD [facility]   If 7PM-7AM, please contact night-coverage [email or web address] Password [facility]  12/14/2017, 6:32 PM

## 2017-12-14 NOTE — ED Notes (Signed)
Report given to Ardae RN at 4W, Room 1434.

## 2017-12-14 NOTE — ED Notes (Signed)
Pt is still altered mental status.

## 2017-12-14 NOTE — ED Notes (Signed)
Bed: RESA Expected date: 12/14/17 Expected time: 11:07 AM Means of arrival: Ambulance Comments: Xanax OD

## 2017-12-15 ENCOUNTER — Observation Stay (HOSPITAL_COMMUNITY): Payer: Medicaid Other

## 2017-12-15 DIAGNOSIS — T424X4A Poisoning by benzodiazepines, undetermined, initial encounter: Secondary | ICD-10-CM

## 2017-12-15 DIAGNOSIS — F1910 Other psychoactive substance abuse, uncomplicated: Secondary | ICD-10-CM

## 2017-12-15 DIAGNOSIS — F200 Paranoid schizophrenia: Secondary | ICD-10-CM

## 2017-12-15 LAB — COMPREHENSIVE METABOLIC PANEL
ALT: 27 U/L (ref 0–44)
ANION GAP: 9 (ref 5–15)
AST: 65 U/L — ABNORMAL HIGH (ref 15–41)
Albumin: 3.4 g/dL — ABNORMAL LOW (ref 3.5–5.0)
Alkaline Phosphatase: 60 U/L (ref 38–126)
BILIRUBIN TOTAL: 0.5 mg/dL (ref 0.3–1.2)
BUN: 7 mg/dL (ref 6–20)
CO2: 31 mmol/L (ref 22–32)
Calcium: 8.7 mg/dL — ABNORMAL LOW (ref 8.9–10.3)
Chloride: 101 mmol/L (ref 98–111)
Creatinine, Ser: 0.85 mg/dL (ref 0.61–1.24)
GFR calc Af Amer: >60 mL/min (ref >60)
Glucose, Bld: 94 mg/dL (ref 70–99)
POTASSIUM: 3.5 mmol/L (ref 3.5–5.1)
Sodium: 141 mmol/L (ref 135–145)
TOTAL PROTEIN: 6.5 g/dL (ref 6.5–8.1)

## 2017-12-15 LAB — HIV ANTIBODY (ROUTINE TESTING W REFLEX): HIV Screen 4th Generation wRfx: NONREACTIVE

## 2017-12-15 NOTE — Progress Notes (Signed)
Patient noticed to be gone from room, IV pulled out and tele pulled and left in the room, unable to locate patient. [employer] officer, [employer] called at [phone] and notified them that patient was gone without being discharged and the staff unaware.

## 2017-12-15 NOTE — Progress Notes (Addendum)
Brandon Moyer  PROGRESS NOTE    Brandon Moyer  ZOX:[id number] DOB: 1991/01/12 DOA: 12/14/2017 PCP: Patient, No Pcp Per Brief Narrative:  27 y.o. male with medical history significant for polysubstance abuse, history of seizures, was brought into the ED by [facility] Department, after after patient was found by police to have a white substance in his hand.  Patient was detained by police and brought to the ER for further management patient was noted to be altered/somnolent.  Patient was able to admit to Xanax use, unable to quantify.  Denies any other current substance abuse, any chest pain, shortness of breath, abdominal pain, fever/chills, nausea/vomiting/diarrhea/constipation.  Unable to get a detailed history from patient as patient was altered.  History mostly gotten from chart review.  ED Course: Patient's vital signs noted to be stable, labs WNL, Tylenol, salicylate level within normal limits.  Due to patient being altered, and admitted for observation  Assessment & Plan:   Principal Problem:   Benzodiazepine overdose of undetermined intent Active Problems:   Polysubstance abuse (HCC)   Paranoid schizophrenia (HCC)  Acute metabolic encephalopathy secondary to polysubstance overdose-urine drug screen positive for amphetamines, THC.  Alcohol salicylates levels were low.  CT head shows no acute intracranial abnormality.  Patient is much more awake, oriented to the hospital.  Psychiatric consult pending.  Labs unremarkable.on Ativan per CIWA protocol.  Has history of marijuana, heroin, cocaine, amphetamine use. ?  Seizure disorder patient takes Neurontin and carbamazepine at home which has not been restarted.    DVT prophylaxis:lovenox Code Status full Family Communication none Disposition Plan:tbd  Consultants: pccm psych  Procedures:none Antimicrobials none  Subjective: Patient resting in bed was able to wake him up he is oriented to the hospital Objective: Vitals:   12/14/17  1758 12/14/17 2339 12/15/17 0710 12/15/17 1229  BP: 126/79 109/80 121/76 122/76  Pulse: 77 82 83 83  Resp: 14 20 12 16   Temp: 98 F (36.7 C) 99 F (37.2 C) 97.9 F (36.6 C) 97.8 F (36.6 C)  TempSrc: Oral  Oral Oral  SpO2: 100% 99% 97% 95%  Weight:      Height:        Intake/Output Summary (Last 24 hours) at 12/15/2017 1407 Last data filed at 12/15/2017 0631 Gross per 24 hour  Intake 243.72 ml  Output -  Net 243.72 ml   Filed Weights   12/14/17 1121  Weight: 81 kg    Examination:  General exam: Appears calm and comfortable  Respiratory system: Clear to auscultation. Respiratory effort normal. Cardiovascular system: S1 & S2 heard, RRR. No JVD, murmurs, rubs, gallops or clicks. No pedal edema. Gastrointestinal system: Abdomen is nondistended, soft and nontender. No organomegaly or masses felt. Normal bowel sounds heard. Central nervous system: Alert and oriented. No focal neurological deficits. Extremities: Symmetric 5 x 5 power. Skin: No rashes, lesions or ulcers    Data Reviewed: I have personally reviewed following labs and imaging studies  CBC: Recent Labs  Lab 12/14/17 1137  WBC 10.5  HGB 16.3  HCT 45.6  MCV 83.7  PLT 394   Basic Metabolic Panel: Recent Labs  Lab 12/14/17 1137 12/15/17 0429  NA 139 141  K 4.2 3.5  CL 99 101  CO2 27 31  GLUCOSE 101* 94  BUN 9 7  CREATININE 1.16 0.85  CALCIUM 9.8 8.7*   GFR: Estimated Creatinine Clearance: 129.4 mL/min (by C-G formula based on SCr of 0.85 mg/dL). Liver Function Tests: Recent Labs  Lab 12/14/17 1137 12/15/17 0429  AST 33 65*  ALT 19 27  ALKPHOS 70 60  BILITOT 1.0 0.5  PROT 7.7 6.5  ALBUMIN 4.1 3.4*   No results for input(s): LIPASE, AMYLASE in the last 168 hours. No results for input(s): AMMONIA in the last 168 hours. Coagulation Profile: No results for input(s): INR, PROTIME in the last 168 hours. Cardiac Enzymes: No results for input(s): CKTOTAL, CKMB, CKMBINDEX, TROPONINI in the last  168 hours. BNP (last 3 results) No results for input(s): PROBNP in the last 8760 hours. HbA1C: No results for input(s): HGBA1C in the last 72 hours. CBG: Recent Labs  Lab 12/14/17 1143  GLUCAP 104*   Lipid Profile: No results for input(s): CHOL, HDL, LDLCALC, TRIG, CHOLHDL, LDLDIRECT in the last 72 hours. Thyroid Function Tests: No results for input(s): TSH, T4TOTAL, FREET4, T3FREE, THYROIDAB in the last 72 hours. Anemia Panel: No results for input(s): VITAMINB12, FOLATE, FERRITIN, TIBC, IRON, RETICCTPCT in the last 72 hours. Sepsis Labs: No results for input(s): PROCALCITON, LATICACIDVEN in the last 168 hours.  No results found for this or any previous visit (from the past 240 hour(s)).       Radiology Studies: Ct Head Wo Contrast  Result Date: 12/15/2017 CLINICAL DATA:  Altered mental status.  Drug abuse. EXAM: CT HEAD WITHOUT CONTRAST TECHNIQUE: Contiguous axial images were obtained from the base of the skull through the vertex without intravenous contrast. COMPARISON:  08/30/2014 FINDINGS: Brain: No evidence of acute infarction, hemorrhage, hydrocephalus, extra-axial collection or mass lesion/mass effect. Stable appearance of a subarachnoid cyst in the posterior fossa. Vascular: No hyperdense vessel or unexpected calcification. Skull: Normal. Negative for fracture or focal lesion. Sinuses/Orbits: No acute finding. Other: None. IMPRESSION: No acute intracranial abnormality. Electronically Signed   By: Ted Mcalpine M.D.   On: 12/15/2017 09:44        Scheduled Meds: . enoxaparin (LOVENOX) injection  40 mg Subcutaneous Q24H  . folic acid  1 mg Oral Daily  . LORazepam  0-4 mg Intravenous Q6H   Followed by  . [START ON 12/16/2017] LORazepam  0-4 mg Intravenous Q12H  . multivitamin with minerals  1 tablet Oral Daily  . sodium chloride flush  3 mL Intravenous Q12H  . thiamine  100 mg Oral Daily   Or  . thiamine  100 mg Intravenous Daily   Continuous Infusions: .  sodium chloride 1,000 mL (12/15/17 1401)     LOS: 0 days     Alwyn Ren, MD [facility] If 7PM-7AM, please contact night-coverage [email or web address] Password [email or web address] 12/15/2017, 2:07 PM

## 2017-12-15 NOTE — Progress Notes (Addendum)
Tech went in room at 1523 to take patients vitals and she found his IV on the bed with his heart monitor. Charge nurse informed ,also th AC,security,[place] police and the MD were also informed that patient had left the hospital. Pt was very drowsy all shift and could not keep his eyes open. Girlfriend was in the room with patient since this morning. Cardiac monitoring center called because they had called the RN Herbert Seta to check his monitor for a run of V-Tach it was around 1530 that they had called,Heather to checked on him a couple minutes later and he was not there.

## 2017-12-18 NOTE — Discharge Summary (Signed)
Physician Discharge Summary  Brandon Moyer ZOX:[id number] DOB: 1990/05/02 DOA: 12/14/2017  PCP: Patient, No Pcp Per  Admit date: 12/14/2017 Discharge date: 12/18/2017  Admitted From: home Disposition: Patient left AGAINST MEDICAL ADVICE.  Recommendations for Outpatient Follow-up:  1. Follow up with PCP in 1-2 weeks  Home Health:none Equipment/Devices:none  Discharge Condition not stable patient left AMA CODE STATUS full code Diet recommendation:27 y.o.malewith medical history significant forpolysubstance abuse,history of seizures,was brought into the ED by [facility] Department, after after patient was found by police to have a white substance in his hand. Patient was detained by police and brought to the ER for further management patient was noted to be altered/somnolent.Patient was able to admit to Xanax use, unable to quantify. Denies any other current substance abuse,any chest pain, shortness of breath, abdominal pain, fever/chills, nausea/vomiting/diarrhea/constipation. Unable to get a detailed history from patient as patient was altered. History mostly gotten from chart review.  ED Course:Patient's vital signs noted to be stable, labsWNL,Tylenol, salicylate level within normal limits. Due to patient being altered, and admitted for observation  Brief/Interim Summary: Discharge Diagnoses:  Principal Problem:   Benzodiazepine overdose of undetermined intent Active Problems:   Polysubstance abuse (HCC)   Paranoid schizophrenia (HCC)  Acute metabolic encephalopathy secondary to polysubstance overdose-urine drug screen positive for amphetamines, THC.  Alcohol salicylates levels were low.  CT head shows no acute intracranial abnormality.  Patient is much more awake, oriented to the hospital.  Psychiatric consult pending.  Labs unremarkable.on Ativan per CIWA protocol.  Has history of marijuana, heroin, cocaine, amphetamine use. ?  Seizure disorder patient takes  Neurontin and carbamazepine at home which has not been restarted.  Patient took off his telemetry monitor, IV, and was not in the room and decrementing to check his vital signs.  Security was called [employer] police was called. Discharge Instructions   Allergies as of 12/15/2017      Reactions   Penicillins Hives   Has patient had a PCN reaction causing immediate rash, facial/tongue/throat swelling, SOB or lightheadedness with hypotension: Yes Has patient had a PCN reaction causing severe rash involving mucus membranes or skin necrosis: Yes Has patient had a PCN reaction that required hospitalization Yes Has patient had a PCN reaction occurring within the last 10 years: Yes If all of the above answers are "NO", then may proceed with Cephalosporin use.      Medication List    ASK your doctor about these medications   asenapine 5 MG Subl 24 hr tablet Commonly known as:  SAPHRIS Place 2 tablets (10 mg total) 2 (two) times daily under the tongue.   carbamazepine 200 MG 12 hr tablet Commonly known as:  TEGRETOL XR Take 1 tablet (200 mg total) 2 (two) times daily by mouth.   gabapentin 300 MG capsule Commonly known as:  NEURONTIN Take 1 capsule (300 mg total) 3 (three) times daily by mouth.   traZODone 50 MG tablet Commonly known as:  DESYREL Take 1 tablet (50 mg total) at bedtime as needed by mouth for sleep.       Allergies  Allergen Reactions  . Penicillins Hives    Has patient had a PCN reaction causing immediate rash, facial/tongue/throat swelling, SOB or lightheadedness with hypotension: Yes Has patient had a PCN reaction causing severe rash involving mucus membranes or skin necrosis: Yes Has patient had a PCN reaction that required hospitalization Yes Has patient had a PCN reaction occurring within the last 10 years: Yes If all of the above answers  are "NO", then may proceed with Cephalosporin use.      Consultations:  none   Procedures/Studies: Ct Head Wo  Contrast  Result Date: 12/15/2017 CLINICAL DATA:  Altered mental status.  Drug abuse. EXAM: CT HEAD WITHOUT CONTRAST TECHNIQUE: Contiguous axial images were obtained from the base of the skull through the vertex without intravenous contrast. COMPARISON:  08/30/2014 FINDINGS: Brain: No evidence of acute infarction, hemorrhage, hydrocephalus, extra-axial collection or mass lesion/mass effect. Stable appearance of a subarachnoid cyst in the posterior fossa. Vascular: No hyperdense vessel or unexpected calcification. Skull: Normal. Negative for fracture or focal lesion. Sinuses/Orbits: No acute finding. Other: None. IMPRESSION: No acute intracranial abnormality. Electronically Signed   By: Ted Mcalpine M.D.   On: 12/15/2017 09:44    (Echo, Carotid, EGD, Colonoscopy, ERCP)    Subjective:   Discharge Exam: Vitals:   12/15/17 0710 12/15/17 1229  BP: 121/76 122/76  Pulse: 83 83  Resp: 12 16  Temp: 97.9 F (36.6 C) 97.8 F (36.6 C)  SpO2: 97% 95%   Vitals:   12/14/17 1758 12/14/17 2339 12/15/17 0710 12/15/17 1229  BP: 126/79 109/80 121/76 122/76  Pulse: 77 82 83 83  Resp: 14 20 12 16   Temp: 98 F (36.7 C) 99 F (37.2 C) 97.9 F (36.6 C) 97.8 F (36.6 C)  TempSrc: Oral  Oral Oral  SpO2: 100% 99% 97% 95%  Weight:      Height:        General: Pt is alert, awake, not in acute distress Cardiovascular: RRR, S1/S2 +, no rubs, no gallops Respiratory: CTA bilaterally, no wheezing, no rhonchi Abdominal: Soft, NT, ND, bowel sounds + Extremities: no edema, no cyanosis    The results of significant diagnostics from this hospitalization (including imaging, microbiology, ancillary and laboratory) are listed below for reference.     Microbiology: No results found for this or any previous visit (from the past 240 hour(s)).   Labs: BNP (last 3 results) No results for input(s): BNP in the last 8760 hours. Basic Metabolic Panel: Recent Labs  Lab 12/14/17 1137 12/15/17 0429  NA  139 141  K 4.2 3.5  CL 99 101  CO2 27 31  GLUCOSE 101* 94  BUN 9 7  CREATININE 1.16 0.85  CALCIUM 9.8 8.7*   Liver Function Tests: Recent Labs  Lab 12/14/17 1137 12/15/17 0429  AST 33 65*  ALT 19 27  ALKPHOS 70 60  BILITOT 1.0 0.5  PROT 7.7 6.5  ALBUMIN 4.1 3.4*   No results for input(s): LIPASE, AMYLASE in the last 168 hours. No results for input(s): AMMONIA in the last 168 hours. CBC: Recent Labs  Lab 12/14/17 1137  WBC 10.5  HGB 16.3  HCT 45.6  MCV 83.7  PLT 394   Cardiac Enzymes: No results for input(s): CKTOTAL, CKMB, CKMBINDEX, TROPONINI in the last 168 hours. BNP: Invalid input(s): POCBNP CBG: Recent Labs  Lab 12/14/17 1143  GLUCAP 104*   D-Dimer No results for input(s): DDIMER in the last 72 hours. Hgb A1c No results for input(s): HGBA1C in the last 72 hours. Lipid Profile No results for input(s): CHOL, HDL, LDLCALC, TRIG, CHOLHDL, LDLDIRECT in the last 72 hours. Thyroid function studies No results for input(s): TSH, T4TOTAL, T3FREE, THYROIDAB in the last 72 hours.  Invalid input(s): FREET3 Anemia work up No results for input(s): VITAMINB12, FOLATE, FERRITIN, TIBC, IRON, RETICCTPCT in the last 72 hours. Urinalysis    Component Value Date/Time   COLORURINE YELLOW 12/14/2017 1830   APPEARANCEUR  CLEAR 12/14/2017 1830   LABSPEC 1.014 12/14/2017 1830   PHURINE 6.0 12/14/2017 1830   GLUCOSEU NEGATIVE 12/14/2017 1830   HGBUR NEGATIVE 12/14/2017 1830   BILIRUBINUR NEGATIVE 12/14/2017 1830   KETONESUR NEGATIVE 12/14/2017 1830   PROTEINUR 30 (A) 12/14/2017 1830   UROBILINOGEN 1.0 02/02/2015 1814   NITRITE NEGATIVE 12/14/2017 1830   LEUKOCYTESUR NEGATIVE 12/14/2017 1830   Sepsis Labs Invalid input(s): PROCALCITONIN,  WBC,  LACTICIDVEN Microbiology No results found for this or any previous visit (from the past 240 hour(s)).   Time coordinating discharge 29 minutes  SIGNED:   Alwyn Ren, MD  [facility] 12/18/2017, 3:54  PM Pager   If 7PM-7AM, please contact night-coverage [email or web address] Password [email or web address]

## 2019-07-29 ENCOUNTER — Emergency Department (HOSPITAL_COMMUNITY): Payer: Self-pay

## 2019-07-29 ENCOUNTER — Other Ambulatory Visit: Payer: Self-pay

## 2019-07-29 ENCOUNTER — Emergency Department (HOSPITAL_COMMUNITY)
Admission: EM | Admit: 2019-07-29 | Discharge: 2019-07-31 | Disposition: A | Discharge status: Home / Self Care | Payer: Self-pay | Attending: Emergency Medicine | Admitting: Emergency Medicine

## 2019-07-29 DIAGNOSIS — J45909 Unspecified asthma, uncomplicated: Secondary | ICD-10-CM | POA: Insufficient documentation

## 2019-07-29 DIAGNOSIS — F17210 Nicotine dependence, cigarettes, uncomplicated: Secondary | ICD-10-CM | POA: Insufficient documentation

## 2019-07-29 DIAGNOSIS — R109 Unspecified abdominal pain: Secondary | ICD-10-CM | POA: Insufficient documentation

## 2019-07-29 DIAGNOSIS — Z79899 Other long term (current) drug therapy: Secondary | ICD-10-CM | POA: Insufficient documentation

## 2019-07-29 DIAGNOSIS — S31139D Puncture wound of abdominal wall without foreign body, unspecified quadrant without penetration into peritoneal cavity, subsequent encounter: Secondary | ICD-10-CM | POA: Insufficient documentation

## 2019-07-29 DIAGNOSIS — R0789 Other chest pain: Secondary | ICD-10-CM | POA: Insufficient documentation

## 2019-07-29 DIAGNOSIS — Z4801 Encounter for change or removal of surgical wound dressing: Secondary | ICD-10-CM | POA: Insufficient documentation

## 2019-07-29 LAB — CBC
HCT: 35 % — ABNORMAL LOW (ref 39.0–52.0)
Hemoglobin: 10.5 g/dL — ABNORMAL LOW (ref 13.0–17.0)
MCH: 24 pg — ABNORMAL LOW (ref 26.0–34.0)
MCHC: 30 g/dL (ref 30.0–36.0)
MCV: 79.9 fL — ABNORMAL LOW (ref 80.0–100.0)
Platelets: 389 10*3/uL (ref 150–400)
RBC: 4.38 MIL/uL (ref 4.22–5.81)
RDW: 19.9 % — ABNORMAL HIGH (ref 11.5–15.5)
WBC: 4.3 10*3/uL (ref 4.0–10.5)
nRBC: 0 % (ref 0.0–0.2)

## 2019-07-29 LAB — BASIC METABOLIC PANEL
Anion gap: 10 (ref 5–15)
BUN: 12 mg/dL (ref 6–20)
CO2: 24 mmol/L (ref 22–32)
Calcium: 9.2 mg/dL (ref 8.9–10.3)
Chloride: 102 mmol/L (ref 98–111)
Creatinine, Ser: 1.42 mg/dL — ABNORMAL HIGH (ref 0.61–1.24)
GFR calc Af Amer: >60 mL/min (ref >60)
GFR calc non Af Amer: >60 mL/min (ref >60)
Glucose, Bld: 96 mg/dL (ref 70–99)
Potassium: 3.4 mmol/L — ABNORMAL LOW (ref 3.5–5.1)
Sodium: 136 mmol/L (ref 135–145)

## 2019-07-29 LAB — TROPONIN I (HIGH SENSITIVITY)
Troponin I (High Sensitivity): 5 ng/L (ref <18)
Troponin I (High Sensitivity): 5 ng/L (ref <18)
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
## 2019-07-29 NOTE — ED Notes (Signed)
Brother's Phone Number- [phone]

## 2019-07-30 ENCOUNTER — Emergency Department (HOSPITAL_COMMUNITY): Payer: Self-pay

## 2019-07-30 LAB — HEPATIC FUNCTION PANEL
ALT: 21 U/L (ref 0–44)
AST: 36 U/L (ref 15–41)
Albumin: 2.4 g/dL — ABNORMAL LOW (ref 3.5–5.0)
Alkaline Phosphatase: 250 U/L — ABNORMAL HIGH (ref 38–126)
Bilirubin, Direct: 0.2 mg/dL (ref 0.0–0.2)
Indirect Bilirubin: 0.3 mg/dL (ref 0.3–0.9)
Total Bilirubin: 0.5 mg/dL (ref 0.3–1.2)
Total Protein: 8.5 g/dL — ABNORMAL HIGH (ref 6.5–8.1)

## 2019-07-30 LAB — CBG MONITORING, ED
Glucose-Capillary: 64 mg/dL — ABNORMAL LOW (ref 70–99)
Glucose-Capillary: 80 mg/dL (ref 70–99)
Glucose-Capillary: 91 mg/dL (ref 70–99)
Glucose-Capillary: 84 mg/dL (ref 70–99)

## 2019-07-30 MED ORDER — HYDROMORPHONE HCL 2 MG PO TABS
2.0000 mg | ORAL_TABLET | Freq: Once | ORAL | Status: AC
  Administered 2019-07-30: 2 mg via ORAL
  Filled 2019-07-30: qty 1

## 2019-07-30 MED ORDER — OXYCODONE-ACETAMINOPHEN 5-325 MG PO TABS
1.0000 | ORAL_TABLET | Freq: Once | ORAL | Status: AC
  Administered 2019-07-30: 1 via ORAL
  Filled 2019-07-30: qty 1
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
## 2019-07-30 NOTE — ED Provider Notes (Signed)
7:34 AM Care assumed from Dr. Eudelia Bunch.  At time of transfer of care, patient is awaiting likely placement this morning as he was recently being cared for in [place] and [place].  Patient is having his records sent up at around 8 AM from this LTAC called [facility] in [place].  Will engage the  case management team to help determine safe placement for this patient.  9:50 AM Patient's paperwork from [facility] arrived.  Will place consult for case management for placement recommendations.  Camille with case management was going to see the patient and determine disposition.  She placed an order for physical therapy.  Awaiting her recommendations for placement and further management.      Geena Weinhold, Canary Brim, MD 07/30/19 1626
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
## 2019-07-30 NOTE — Progress Notes (Signed)
CSW was unable to complete PASRR screening due to lack of latest medical records not yet updated from [facility]. They are noted to have arrived but not yet in Epic so cannot input medications, diagnoses, etc. Suspect that a Level II will be necessary based on Pt hx. Spoke with Cannon, Arreola @[phone],  [phone] @[phone], and brother, @ [phone] to update. Emailed mom with Medicaid info.  Emailed detailed update to Supervisor Gerlene Burdock and cc'ed  Director [phone].  CSW faxed out FL2 and therapy notes to various area SNFs for placement.
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
## 2019-07-30 NOTE — Discharge Planning (Signed)
RNCM and EDSW met with pt and brother at bedside regarding disposition plan. Pt left from [facility] AMA to be closer to family in [place].  TOC team obtained insurance information (uninsured) and next of kin contact information.  TOC team advised pt of need for PT evaluation before proceeding with discharge plan.

## 2019-07-30 NOTE — ED Notes (Signed)
Out to CT 
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
## 2019-07-31 LAB — CBG MONITORING, ED
Glucose-Capillary: 84 mg/dL (ref 70–99)
Glucose-Capillary: 95 mg/dL (ref 70–99)

## 2019-07-31 MED ORDER — MIDODRINE HCL 10 MG PO TABS: 5.0000 mg | ORAL_TABLET | Freq: Three times a day (TID) | ORAL | 0 refills | Status: AC

## 2019-07-31 MED ORDER — HYDROMORPHONE HCL 2 MG PO TABS
2.0000 mg | ORAL_TABLET | Freq: Four times a day (QID) | ORAL | Status: DC | PRN
  Administered 2019-07-31 (×3): 2 mg via ORAL
  Filled 2019-07-31 (×3): qty 1

## 2019-07-31 MED ORDER — DOCUSATE SODIUM 100 MG PO CAPS: 100.0000 mg | ORAL_CAPSULE | Freq: Two times a day (BID) | ORAL | 0 refills | Status: DC

## 2019-07-31 MED ORDER — TRAZODONE HCL 50 MG PO TABS
50.0000 mg | ORAL_TABLET | Freq: Every evening | ORAL | Status: DC | PRN
  Administered 2019-07-31: 50 mg via ORAL
  Filled 2019-07-31 (×2): qty 1

## 2019-07-31 MED ORDER — FLUDROCORTISONE ACETATE 0.1 MG PO TABS: 0.1000 mg | ORAL_TABLET | Freq: Two times a day (BID) | ORAL | 0 refills | Status: AC

## 2019-07-31 MED ORDER — FENTANYL 25 MCG/HR TD PT72: 1.0000 | MEDICATED_PATCH | TRANSDERMAL | 0 refills | Status: DC

## 2019-07-31 MED ORDER — HYDROMORPHONE HCL 2 MG PO TABS
2.0000 mg | ORAL_TABLET | Freq: Once | ORAL | Status: AC
  Administered 2019-07-31: 01:00:00 2 mg via ORAL
  Filled 2019-07-31: qty 1

## 2019-07-31 MED ORDER — CERTA-VITE PO LIQD: 15.0000 mL | Freq: Every day | ORAL | 0 refills | Status: DC

## 2019-07-31 MED ORDER — RIVAROXABAN 20 MG PO TABS
20.0000 mg | ORAL_TABLET | Freq: Every day | ORAL | Status: DC
  Administered 2019-07-31: 20 mg via ORAL
  Filled 2019-07-31 (×2): qty 1

## 2019-07-31 MED ORDER — OLANZAPINE 5 MG PO TBDP: 5.0000 mg | ORAL_TABLET | Freq: Every day | ORAL | 0 refills | Status: DC

## 2019-07-31 MED ORDER — MIDODRINE HCL 5 MG PO TABS
10.0000 mg | ORAL_TABLET | Freq: Four times a day (QID) | ORAL | Status: DC
  Administered 2019-07-31 (×3): 10 mg via ORAL
  Filled 2019-07-31 (×6): qty 2

## 2019-07-31 MED ORDER — GABAPENTIN 300 MG PO CAPS
300.0000 mg | ORAL_CAPSULE | Freq: Three times a day (TID) | ORAL | Status: DC
  Administered 2019-07-31 (×2): 300 mg via ORAL
  Filled 2019-07-31 (×3): qty 1

## 2019-07-31 MED ORDER — RIVAROXABAN 20 MG PO TABS: 20.0000 mg | ORAL_TABLET | Freq: Every day | ORAL | 0 refills | Status: DC

## 2019-07-31 MED ORDER — OXYCODONE-ACETAMINOPHEN 5-325 MG PO TABS: 1.0000 | ORAL_TABLET | Freq: Three times a day (TID) | ORAL | 0 refills | Status: DC | PRN

## 2019-07-31 MED ORDER — CLONAZEPAM 0.5 MG PO TABS: 0.5000 mg | ORAL_TABLET | Freq: Two times a day (BID) | ORAL | 0 refills | Status: DC | PRN

## 2019-07-31 MED ORDER — MUPIROCIN CALCIUM 2 % EX CREA: 1.0000 "application " | TOPICAL_CREAM | Freq: Two times a day (BID) | CUTANEOUS | 0 refills | Status: DC

## 2019-07-31 MED ORDER — TRAZODONE HCL 50 MG PO TABS: 50.0000 mg | ORAL_TABLET | Freq: Every day | ORAL | 0 refills | Status: DC

## 2019-07-31 MED ORDER — FLUOXETINE HCL 40 MG PO CAPS: 40.0000 mg | ORAL_CAPSULE | Freq: Two times a day (BID) | ORAL | 0 refills | Status: AC

## 2019-07-31 MED ORDER — ONDANSETRON 4 MG PO TBDP: 4.0000 mg | ORAL_TABLET | Freq: Three times a day (TID) | ORAL | 0 refills | Status: DC | PRN

## 2019-07-31 MED ORDER — METHOCARBAMOL 500 MG PO TABS: 500.0000 mg | ORAL_TABLET | Freq: Four times a day (QID) | ORAL | 0 refills | Status: DC | PRN

## 2019-07-31 MED ORDER — FAMOTIDINE 20 MG PO TABS: 20.0000 mg | ORAL_TABLET | Freq: Two times a day (BID) | ORAL | 0 refills | Status: DC

## 2019-07-31 MED ORDER — LACTULOSE 20 GM/30ML PO SOLN: 15.0000 mL | Freq: Two times a day (BID) | ORAL | 0 refills | Status: DC

## 2019-07-31 MED ORDER — HYDROMORPHONE HCL 2 MG PO TABS: 2.0000 mg | ORAL_TABLET | Freq: Three times a day (TID) | ORAL | 0 refills | Status: DC | PRN

## 2019-07-31 MED ORDER — GABAPENTIN 300 MG PO CAPS: 300.0000 mg | ORAL_CAPSULE | Freq: Three times a day (TID) | ORAL | 0 refills | Status: DC

## 2019-07-31 NOTE — Care Management (Signed)
  MATCH Medication Assistance Card Name: Xayvier Vallez ID (MRN): [id number] Bin: [id number] RX Group: [id number] Discharge Date: 07/31/2019 Expiration Date:08/10/2019                                           (must be filled within 7 days of discharge)     Dear   : [name]  You have been approved to have the prescriptions written by your discharging physician filled through our [facility] (Medication Assistance Through [facility]) program. This program allows for a one-time (no refills) 34-day supply of selected medications for a low copay amount.  The copay is $3.00 per prescription. For instance, if you have one prescription, you will pay $3.00; for two prescriptions, you pay $6.00; for three prescriptions, you pay $9.00; and so on.  Only certain pharmacies are participating in this program with [facility]. You will need to select one of the pharmacies from the attached list and take your prescriptions, this letter, and your photo ID to one of the participating pharmacies.   We are excited that you are able to use the [facility] program to get your medications. These prescriptions must be filled within 7 days of hospital discharge or they will no longer be valid for the [facility] program. Should you have any problems with your prescriptions please contact your case management team member at [phone] for [place]/ [facility].  Thank you, [facility] Health Care Management

## 2019-07-31 NOTE — Discharge Planning (Signed)
RNCM consulted regarding home health services for uninsured patient.  RNCM contacted [facility] and due to pt complex needs, pt will not qualify for charity funds.  RNCM suggests pt hiring private medical home care company to assist with medical and activity daily living needs.

## 2019-07-31 NOTE — ED Notes (Signed)
bfast ordered 

## 2019-07-31 NOTE — ED Notes (Signed)
Dinner ordered 
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
## 2019-07-31 NOTE — Progress Notes (Addendum)
CSW printed a Psychologist, occupational" for Pt and family including: PCP resources, private pay home health resources, resources for free outpatient physical therapy, and options for ways to find privately purchased health insurance, and how to apply for free ostomy supplies.
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
## 2019-07-31 NOTE — ED Notes (Signed)
LTAC placement

## 2019-07-31 NOTE — ED Notes (Signed)
Brandon Moyer mother [phone] wants to ask a few questions

## 2019-07-31 NOTE — Consult Note (Signed)
WOC Nurse Consult Note: Patient receiving care in [facility] ED 052C.  Awaiting LTAC placement in the state of [place].  Consult completed remotely after review of record. Reason for Consult: abdominal wounds and "drains" Wound type: healing abdominal surgical wounds--see photos Pressure Injury POA: Yes/No/NA Measurement: To be provided by the bedside RN in the flowsheet section Wound bed: all are pink, clean Drainage (amount, consistency, odor) none seen in photos Periwound: scarred from multiple surgeries at out of state hospital Dressing procedure/placement/frequency: saline moistened gauze twice daily to healing abdominal wounds, soap and water cleaning and split gauze to all abdominal "drains", and ostomy supply orders have been entered. Monitor the wound area(s) for worsening of condition such as: Signs/symptoms of infection,  Increase in size,  Development of or worsening of odor, Development of pain, or increased pain at the affected locations.  Notify the medical team if any of these develop.  Thank you for the consult.  WOC nurse will not follow at this time.  Please re-consult the WOC team if needed.  Helmut Muster, RN, MSN, CWOCN, CNS-BC, pager [phone]

## 2019-07-31 NOTE — ED Notes (Addendum)
Brother at bedside. RN discussed that pt can take care of himself at home and do the majority of his care. RN had him empty his own ostomy earlier. RN provided teaching on odor management since pt said "this smell is disgusting and makes me want to vomit."  Pt said he needed to sit up to take his meds. RN showed him  That [facility] can be lowered and he can use the hand rails to pull himself up. Brother at bedside states pt has been on TPN before he left LTAC. RN discussed that lots of people are on TPN at home. Education provided on kangaroo pump and that pt can actually manage it himself with very little to no assistance. Pt became angry- stating "they dont have time to take care of me." RN discussed that he can do the majority of his care himself with very little support from his family.  Pt states he is in pain and wants to know when he can get dilaudid again. RN advised it is Q 6 and he had at 0648.  PT/OT at bedside at this time.

## 2019-07-31 NOTE — ED Provider Notes (Signed)
I was contacted by nursing who state that the patient is ready to go home and that his father will be picking him up to take him to his home.  She reported that the patient needs prescriptions and discharge information.  7:55 PM-he is alert and comfortable.  He states he is ready to leave.  He wants prescription sent to his pharmacy, [facility] in [place], [place].  I have discussed with transitions of care team, they are making arrangements to get him some follow-up, will help pay for his initial medicines, and will get him some ostomy supplies.  They have given additional recommendations for home health care.   Mancel Bale, MD 07/31/19 [phone]

## 2019-07-31 NOTE — ED Notes (Signed)
RN assisted pt with emptying ostomy, and JP drain. Pt was able to complete tasks independently, but stated he wanted RN at bedside just to make sure he was doing it correctly.

## 2019-07-31 NOTE — Progress Notes (Signed)
CSW spoke with father via phone on speaker at bedside with Pt. CSW informed father that Pt will be discharged and that he needs a place to stay. Father states that he was at work and could not speak to CSW. CSW is awaiting callback.
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
## 2019-07-31 NOTE — ED Provider Notes (Signed)
29 y/o M presenting to the ED after leaving his LTAC AMA from [place]. Endorsed that he left because he wanted to be closer ty family. He has no complaints this AM. He is requesting coffee and water.   Physical Exam  BP (!) 92/45 (BP Location: Right Arm)   Pulse 82   Temp 98.3 F (36.8 C) (Oral)   Resp 16   Ht 5\' 9"  (1.753 m)   Wt 77.1 kg   SpO2 98%   BMI 25.10 kg/m   Physical Exam Constitutional:      General: He is not in acute distress.    Appearance: He is well-developed.  Eyes:     Conjunctiva/sclera: Conjunctivae normal.  Cardiovascular:     Rate and Rhythm: Normal rate and regular rhythm.  Pulmonary:     Effort: Pulmonary effort is normal.     Breath sounds: Normal breath sounds.  Skin:    General: Skin is warm and dry.  Neurological:     Mental Status: He is alert and oriented to person, place, and time.       ED Course/Procedures     Procedures  Results for orders placed or performed during the hospital encounter of 61/60/73  Basic metabolic panel  Result Value Ref Range   Sodium 136 135 - 145 mmol/L   Potassium 3.4 (L) 3.5 - 5.1 mmol/L   Chloride 102 98 - 111 mmol/L   CO2 24 22 - 32 mmol/L   Glucose, Bld 96 70 - 99 mg/dL   BUN 12 6 - 20 mg/dL   Creatinine, Ser 1.42 (H) 0.61 - 1.24 mg/dL   Calcium 9.2 8.9 - 10.3 mg/dL   GFR calc non Af Amer >60 >60 mL/min   GFR calc Af Amer >60 >60 mL/min   Anion gap 10 5 - 15  CBC  Result Value Ref Range   WBC 4.3 4.0 - 10.5 K/uL   RBC 4.38 4.22 - 5.81 MIL/uL   Hemoglobin 10.5 (L) 13.0 - 17.0 g/dL   HCT 35.0 (L) 39.0 - 52.0 %   MCV 79.9 (L) 80.0 - 100.0 fL   MCH 24.0 (L) 26.0 - 34.0 pg   MCHC 30.0 30.0 - 36.0 g/dL   RDW 19.9 (H) 11.5 - 15.5 %   Platelets 389 150 - 400 K/uL   nRBC 0.0 0.0 - 0.2 %  Hepatic function panel  Result Value Ref Range   Total Protein 8.5 (H) 6.5 - 8.1 g/dL   Albumin 2.4 (L) 3.5 - 5.0 g/dL   AST 36 15 - 41 U/L   ALT 21 0 - 44 U/L   Alkaline Phosphatase 250 (H) 38 - 126 U/L   Total Bilirubin 0.5 0.3 - 1.2 mg/dL   Bilirubin, Direct 0.2 0.0 - 0.2 mg/dL   Indirect Bilirubin 0.3 0.3 - 0.9 mg/dL  CBG monitoring, ED  Result Value Ref Range   Glucose-Capillary 64 (L) 70 - 99 mg/dL  CBG monitoring, ED  Result Value Ref Range   Glucose-Capillary 80 70 - 99 mg/dL  CBG monitoring, ED  Result Value Ref Range   Glucose-Capillary 91 70 - 99 mg/dL  CBG monitoring, ED  Result Value Ref Range   Glucose-Capillary 84 70 - 99 mg/dL  POC CBG, ED  Result Value Ref Range   Glucose-Capillary 84 70 - 99 mg/dL  Troponin I (High Sensitivity)  Result Value Ref Range   Troponin I (High Sensitivity) 5 <18 ng/L  Troponin I (High Sensitivity)  Result Value Ref  Range   Troponin I (High Sensitivity) 5 <18 ng/L   CT ABDOMEN PELVIS WO CONTRAST  Result Date: 07/30/2019 CLINICAL DATA:  Abdominal pain. EXAM: CT ABDOMEN AND PELVIS WITHOUT CONTRAST TECHNIQUE: Multidetector CT imaging of the abdomen and pelvis was performed following the standard protocol without IV contrast. COMPARISON:  CT abdomen pelvis 08/30/2014 FINDINGS: Lower chest: Nodular densities in the right lower lobe likely related to prior infection inflammation. A small right pleural effusion is present with associated atelectasis. Dependent atelectasis is present at the left base. Heart size is. No significant pleural or pericardial effusion present. Left hemidiaphragm is elevated. Hepatobiliary: No discrete hepatic lesions are present. Densities within the gallbladder likely represent stones. No inflammatory changes are present about the gallbladder. Pancreas: Postsurgical changes are noted at the pancreatic head. Tail of pancreas is within normal limits. Spleen: Normal in size without focal abnormality. Adrenals/Urinary Tract: Adrenal glands are normal bilaterally. Right nephrectomy is noted. Left kidney and ureter are normal. No stone or mass lesion is present. The urinary bladder is within normal limits. Stomach/Bowel: Jejunostomy  tube is in place. Right upper quadrant pigtail catheter drain is position within a collection measuring 6.6 x 4.6 x 3.5 cm. Distal small bowel is within normal limits. A right lower quadrant ostomy is noted. Ascending colon is resected. Distal colon is unremarkable. Vascular/Lymphatic: No significant vascular findings are present. No enlarged abdominal or pelvic lymph nodes. Reproductive: Prostate is unremarkable. Other: No abdominal wall hernia or abnormality. No abdominopelvic ascites. Musculoskeletal: Curvature is present in the lumbar spine. Vertebral body heights and alignment are maintained. No focal lytic or blastic lesions are present. IMPRESSION: 1. Right upper quadrant pigtail catheter drain is position within a collection measuring 6.6 x 4.6 x 3.5 cm. 2. Postsurgical changes at the pancreatic head and right nephrectomy. 3. Right hemicolectomy. 4. Right lower quadrant ileostomy. 5. Cholelithiasis without evidence for cholecystitis. 6. Nodular densities in the right lower lobe likely related to prior infection inflammation. Non-contrast chest CT at 3-6 months is recommended. If the nodules are stable at time of repeat CT, then future CT at 18-24 months (from today's scan) is considered optional for low-risk patients, but is recommended for high-risk patients. This recommendation follows the consensus statement: Guidelines for Management of Incidental Pulmonary Nodules Detected on CT Images: From the Fleischner Society 2017; Radiology 2017; 284:228-243. 7. Small right pleural effusion with associated atelectasis. Electronically Signed   By: Marin Roberts M.D.   On: 07/30/2019 04:37   DG Chest 2 View  Result Date: 07/29/2019 CLINICAL DATA:  Chronic chest pain EXAM: CHEST - 2 VIEW COMPARISON:  01/23/2017 FINDINGS: Frontal and lateral views of the chest demonstrates stable cardiac silhouette. No airspace disease, effusion, or pneumothorax. No acute bony abnormalities. Radiopaque catheters are seen  within the left hemiabdomen. IMPRESSION: 1. No acute intrathoracic process. Electronically Signed   By: Sharlet Salina M.D.   On: 07/29/2019 18:28     MDM    29 y/o M presenting to the ED after leaving his LTAC AMA from [place]. Endorsed that he left because he wanted to be closer ty family. He has no complaints this AM. He is requesting coffee and water.   Pt awaiting placement this morning at an LTAC. Case management is involved. He will have PT eval.       Karrie Meres, PA-C 07/31/19 1443    Tegeler, Canary Brim, MD 07/31/19 [phone]
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
## 2019-08-01 ENCOUNTER — Telehealth: Payer: Self-pay | Admitting: *Deleted

## 2019-08-01 NOTE — Telephone Encounter (Signed)
Pt and brother calling to see if we have the measurements of his ostomy as he is attempting to secure supplies.  RNCM reviewed chart and did not find measurements documented.  RNCM advised to purchase supplies that he can customize/cut to size until he is able to have it done by a medical professional.

## 2019-08-01 NOTE — Telephone Encounter (Signed)
Pharmacy called regarding pt arriving to obtain Rx for $3 each.  This Pharmacy is not familiar with MATCH program and does not have all narcotics in stock, but reached out to sister store ([facility]) who is familiar with MATCH and has all pt in stock.    Pharmacy suggests transferring Rx to [facility] store.  RNCM will contact prescribing EDP to transfer Rx to [facility].  RNCM faxed MATCH card information to [facility] in [place] [phone].

## 2019-08-26 ENCOUNTER — Ambulatory Visit: Payer: Medicaid Other | Admitting: Family Medicine

## 2019-08-26 ENCOUNTER — Encounter (HOSPITAL_COMMUNITY): Payer: Self-pay | Admitting: Emergency Medicine

## 2019-08-26 ENCOUNTER — Inpatient Hospital Stay (HOSPITAL_COMMUNITY)
Admission: EM | Admit: 2019-08-26 | Discharge: 2019-09-05 | DRG: 378 | Disposition: A | Discharge status: Home / Self Care | Payer: Self-pay | Attending: Internal Medicine | Admitting: Internal Medicine

## 2019-08-26 ENCOUNTER — Other Ambulatory Visit: Payer: Self-pay

## 2019-08-26 DIAGNOSIS — N189 Chronic kidney disease, unspecified: Secondary | ICD-10-CM | POA: Diagnosis present

## 2019-08-26 DIAGNOSIS — Z7901 Long term (current) use of anticoagulants: Secondary | ICD-10-CM

## 2019-08-26 DIAGNOSIS — D509 Iron deficiency anemia, unspecified: Secondary | ICD-10-CM | POA: Diagnosis present

## 2019-08-26 DIAGNOSIS — F1120 Opioid dependence, uncomplicated: Secondary | ICD-10-CM | POA: Diagnosis present

## 2019-08-26 DIAGNOSIS — F1210 Cannabis abuse, uncomplicated: Secondary | ICD-10-CM | POA: Diagnosis present

## 2019-08-26 DIAGNOSIS — F17210 Nicotine dependence, cigarettes, uncomplicated: Secondary | ICD-10-CM | POA: Diagnosis present

## 2019-08-26 DIAGNOSIS — Z905 Acquired absence of kidney: Secondary | ICD-10-CM

## 2019-08-26 DIAGNOSIS — R188 Other ascites: Secondary | ICD-10-CM

## 2019-08-26 DIAGNOSIS — F1410 Cocaine abuse, uncomplicated: Secondary | ICD-10-CM | POA: Diagnosis present

## 2019-08-26 DIAGNOSIS — K922 Gastrointestinal hemorrhage, unspecified: Secondary | ICD-10-CM | POA: Diagnosis present

## 2019-08-26 DIAGNOSIS — Z934 Other artificial openings of gastrointestinal tract status: Secondary | ICD-10-CM

## 2019-08-26 DIAGNOSIS — Z79899 Other long term (current) drug therapy: Secondary | ICD-10-CM

## 2019-08-26 DIAGNOSIS — F321 Major depressive disorder, single episode, moderate: Secondary | ICD-10-CM | POA: Diagnosis present

## 2019-08-26 DIAGNOSIS — K280 Acute gastrojejunal ulcer with hemorrhage: Principal | ICD-10-CM | POA: Diagnosis present

## 2019-08-26 DIAGNOSIS — Z933 Colostomy status: Secondary | ICD-10-CM

## 2019-08-26 DIAGNOSIS — E46 Unspecified protein-calorie malnutrition: Secondary | ICD-10-CM | POA: Diagnosis present

## 2019-08-26 DIAGNOSIS — F1510 Other stimulant abuse, uncomplicated: Secondary | ICD-10-CM | POA: Diagnosis present

## 2019-08-26 DIAGNOSIS — Z833 Family history of diabetes mellitus: Secondary | ICD-10-CM

## 2019-08-26 DIAGNOSIS — N179 Acute kidney failure, unspecified: Secondary | ICD-10-CM | POA: Diagnosis present

## 2019-08-26 DIAGNOSIS — F200 Paranoid schizophrenia: Secondary | ICD-10-CM | POA: Diagnosis present

## 2019-08-26 DIAGNOSIS — Z9049 Acquired absence of other specified parts of digestive tract: Secondary | ICD-10-CM

## 2019-08-26 DIAGNOSIS — G8929 Other chronic pain: Secondary | ICD-10-CM | POA: Diagnosis present

## 2019-08-26 DIAGNOSIS — D62 Acute posthemorrhagic anemia: Secondary | ICD-10-CM | POA: Diagnosis present

## 2019-08-26 DIAGNOSIS — F411 Generalized anxiety disorder: Secondary | ICD-10-CM | POA: Diagnosis present

## 2019-08-26 DIAGNOSIS — K632 Fistula of intestine: Secondary | ICD-10-CM

## 2019-08-26 DIAGNOSIS — L89329 Pressure ulcer of left buttock, unspecified stage: Secondary | ICD-10-CM | POA: Diagnosis present

## 2019-08-26 DIAGNOSIS — D649 Anemia, unspecified: Secondary | ICD-10-CM

## 2019-08-26 DIAGNOSIS — Z9119 Patient's noncompliance with other medical treatment and regimen: Secondary | ICD-10-CM

## 2019-08-26 DIAGNOSIS — E869 Volume depletion, unspecified: Secondary | ICD-10-CM | POA: Diagnosis present

## 2019-08-26 DIAGNOSIS — Z86718 Personal history of other venous thrombosis and embolism: Secondary | ICD-10-CM

## 2019-08-26 DIAGNOSIS — Z20822 Contact with and (suspected) exposure to covid-19: Secondary | ICD-10-CM | POA: Diagnosis present

## 2019-08-26 LAB — COMPREHENSIVE METABOLIC PANEL
ALT: 33 U/L (ref 0–44)
AST: 43 U/L — ABNORMAL HIGH (ref 15–41)
Albumin: 2.4 g/dL — ABNORMAL LOW (ref 3.5–5.0)
Alkaline Phosphatase: 370 U/L — ABNORMAL HIGH (ref 38–126)
Anion gap: 11 (ref 5–15)
BUN: 14 mg/dL (ref 6–20)
CO2: 21 mmol/L — ABNORMAL LOW (ref 22–32)
Calcium: 8.7 mg/dL — ABNORMAL LOW (ref 8.9–10.3)
Chloride: 102 mmol/L (ref 98–111)
Creatinine, Ser: 1.38 mg/dL — ABNORMAL HIGH (ref 0.61–1.24)
GFR calc Af Amer: >60 mL/min (ref >60)
GFR calc non Af Amer: >60 mL/min (ref >60)
Glucose, Bld: 122 mg/dL — ABNORMAL HIGH (ref 70–99)
Potassium: 4.5 mmol/L (ref 3.5–5.1)
Sodium: 134 mmol/L — ABNORMAL LOW (ref 135–145)
Total Bilirubin: 1.4 mg/dL — ABNORMAL HIGH (ref 0.3–1.2)
Total Protein: 7.5 g/dL (ref 6.5–8.1)

## 2019-08-26 LAB — ABO/RH: ABO/RH(D): B POS

## 2019-08-26 LAB — POC OCCULT BLOOD, ED: Fecal Occult Bld: POSITIVE — AB

## 2019-08-26 MED ORDER — OXYCODONE-ACETAMINOPHEN 5-325 MG PO TABS
1.0000 | ORAL_TABLET | Freq: Once | ORAL | Status: AC
  Administered 2019-08-26: 1 via ORAL
  Filled 2019-08-26: qty 1

## 2019-08-26 MED ORDER — SODIUM CHLORIDE 0.9 % IV SOLN
80.0000 mg | Freq: Once | INTRAVENOUS | Status: AC
  Administered 2019-08-27: 80 mg via INTRAVENOUS
  Filled 2019-08-26: qty 80

## 2019-08-26 MED ORDER — SODIUM CHLORIDE 0.9 % IV BOLUS
1000.0000 mL | Freq: Once | INTRAVENOUS | Status: AC
  Administered 2019-08-27: 1000 mL via INTRAVENOUS
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
## 2019-08-26 NOTE — ED Triage Notes (Signed)
Pt here with c/o colostomy bag problems , bag is intact not leaking , pt is also asking for a klonopin that he has recently ran out of
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
## 2019-08-27 ENCOUNTER — Other Ambulatory Visit: Payer: Self-pay

## 2019-08-27 ENCOUNTER — Emergency Department (HOSPITAL_COMMUNITY): Payer: Self-pay

## 2019-08-27 ENCOUNTER — Encounter (HOSPITAL_COMMUNITY): Payer: Self-pay | Admitting: Radiology

## 2019-08-27 DIAGNOSIS — K922 Gastrointestinal hemorrhage, unspecified: Secondary | ICD-10-CM

## 2019-08-27 DIAGNOSIS — Z86718 Personal history of other venous thrombosis and embolism: Secondary | ICD-10-CM

## 2019-08-27 DIAGNOSIS — G8929 Other chronic pain: Secondary | ICD-10-CM

## 2019-08-27 DIAGNOSIS — D62 Acute posthemorrhagic anemia: Secondary | ICD-10-CM

## 2019-08-27 LAB — URINALYSIS, ROUTINE W REFLEX MICROSCOPIC
Bacteria, UA: NONE SEEN
Bilirubin Urine: NEGATIVE
Glucose, UA: NEGATIVE mg/dL
Hgb urine dipstick: NEGATIVE
Ketones, ur: NEGATIVE mg/dL
Leukocytes,Ua: NEGATIVE
Nitrite: NEGATIVE
Protein, ur: 100 mg/dL — AB
Specific Gravity, Urine: 1.028 (ref 1.005–1.030)
pH: 5 (ref 5.0–8.0)

## 2019-08-27 LAB — CBC WITH DIFFERENTIAL/PLATELET
Abs Immature Granulocytes: 0.04 10*3/uL (ref 0.00–0.07)
Abs Immature Granulocytes: 0.01 10*3/uL (ref 0.00–0.07)
Basophils Absolute: 0 10*3/uL (ref 0.0–0.1)
Basophils Absolute: 0 10*3/uL (ref 0.0–0.1)
Basophils Relative: 1 %
Basophils Relative: 0 %
Eosinophils Absolute: 0.2 10*3/uL (ref 0.0–0.5)
Eosinophils Absolute: 0.2 10*3/uL (ref 0.0–0.5)
Eosinophils Relative: 4 %
Eosinophils Relative: 4 %
HCT: 22.1 % — ABNORMAL LOW (ref 39.0–52.0)
HCT: 27.8 % — ABNORMAL LOW (ref 39.0–52.0)
Hemoglobin: 6.8 g/dL — CL (ref 13.0–17.0)
Hemoglobin: 8.5 g/dL — ABNORMAL LOW (ref 13.0–17.0)
Immature Granulocytes: 1 %
Immature Granulocytes: 0 %
Lymphocytes Relative: 18 %
Lymphocytes Relative: 21 %
Lymphs Abs: 1.1 10*3/uL (ref 0.7–4.0)
Lymphs Abs: 1.1 10*3/uL (ref 0.7–4.0)
MCH: 24.4 pg — ABNORMAL LOW (ref 26.0–34.0)
MCH: 25.5 pg — ABNORMAL LOW (ref 26.0–34.0)
MCHC: 30.8 g/dL (ref 30.0–36.0)
MCHC: 30.6 g/dL (ref 30.0–36.0)
MCV: 79.2 fL — ABNORMAL LOW (ref 80.0–100.0)
MCV: 83.5 fL (ref 80.0–100.0)
Monocytes Absolute: 0.5 10*3/uL (ref 0.1–1.0)
Monocytes Absolute: 0.4 10*3/uL (ref 0.1–1.0)
Monocytes Relative: 8 %
Monocytes Relative: 8 %
Neutro Abs: 4.4 10*3/uL (ref 1.7–7.7)
Neutro Abs: 3.4 10*3/uL (ref 1.7–7.7)
Neutrophils Relative %: 68 %
Neutrophils Relative %: 67 %
Platelets: 240 10*3/uL (ref 150–400)
Platelets: 227 10*3/uL (ref 150–400)
RBC: 2.79 MIL/uL — ABNORMAL LOW (ref 4.22–5.81)
RBC: 3.33 MIL/uL — ABNORMAL LOW (ref 4.22–5.81)
RDW: 19 % — ABNORMAL HIGH (ref 11.5–15.5)
RDW: 18.2 % — ABNORMAL HIGH (ref 11.5–15.5)
WBC: 6.4 10*3/uL (ref 4.0–10.5)
WBC: 5 10*3/uL (ref 4.0–10.5)
nRBC: 0 % (ref 0.0–0.2)
nRBC: 0 % (ref 0.0–0.2)

## 2019-08-27 LAB — POC SARS CORONAVIRUS 2 AG -  ED

## 2019-08-27 LAB — SARS CORONAVIRUS 2 BY RT PCR (HOSPITAL ORDER, PERFORMED IN ~~LOC~~ HOSPITAL LAB): SARS Coronavirus 2: NEGATIVE

## 2019-08-27 LAB — PROTIME-INR
INR: 1.3 — ABNORMAL HIGH (ref 0.8–1.2)
Prothrombin Time: 15.3 seconds — ABNORMAL HIGH (ref 11.4–15.2)

## 2019-08-27 LAB — RAPID URINE DRUG SCREEN, HOSP PERFORMED
Amphetamines: NOT DETECTED
Barbiturates: NOT DETECTED
Benzodiazepines: NOT DETECTED
Cocaine: NOT DETECTED
Opiates: POSITIVE — AB
Tetrahydrocannabinol: NOT DETECTED

## 2019-08-27 LAB — HIV ANTIBODY (ROUTINE TESTING W REFLEX): HIV Screen 4th Generation wRfx: NONREACTIVE

## 2019-08-27 LAB — ETHANOL: Alcohol, Ethyl (B): <10 mg/dL (ref <10)

## 2019-08-27 LAB — APTT: aPTT: 42 seconds — ABNORMAL HIGH (ref 24–36)

## 2019-08-27 LAB — PREPARE RBC (CROSSMATCH)

## 2019-08-27 LAB — HEMOGLOBIN AND HEMATOCRIT, BLOOD
HCT: 29.2 % — ABNORMAL LOW (ref 39.0–52.0)
Hemoglobin: 9.5 g/dL — ABNORMAL LOW (ref 13.0–17.0)

## 2019-08-27 MED ORDER — TRAZODONE HCL 50 MG PO TABS: 50.0000 mg | ORAL_TABLET | Freq: Every evening | ORAL | Status: DC | PRN

## 2019-08-27 MED ORDER — SODIUM CHLORIDE 0.9 % IV SOLN: INTRAVENOUS | Status: DC

## 2019-08-27 MED ORDER — MIDODRINE HCL 5 MG PO TABS
5.0000 mg | ORAL_TABLET | Freq: Three times a day (TID) | ORAL | Status: DC
  Administered 2019-08-27 – 2019-09-05 (×24): 5 mg via ORAL
  Filled 2019-08-27 (×25): qty 1

## 2019-08-27 MED ORDER — GABAPENTIN 300 MG PO CAPS
300.0000 mg | ORAL_CAPSULE | Freq: Three times a day (TID) | ORAL | Status: DC
  Administered 2019-08-27 – 2019-09-05 (×27): 300 mg via ORAL
  Filled 2019-08-27 (×28): qty 1

## 2019-08-27 MED ORDER — FLUDROCORTISONE ACETATE 0.1 MG PO TABS: 0.1000 mg | ORAL_TABLET | Freq: Two times a day (BID) | ORAL | Status: DC

## 2019-08-27 MED ORDER — FLUDROCORTISONE ACETATE 0.1 MG PO TABS
0.1000 mg | ORAL_TABLET | Freq: Two times a day (BID) | ORAL | Status: DC
  Administered 2019-08-27 – 2019-09-05 (×17): 0.1 mg via ORAL
  Filled 2019-08-27 (×21): qty 1

## 2019-08-27 MED ORDER — SODIUM CHLORIDE 0.9 % IV BOLUS
500.0000 mL | Freq: Once | INTRAVENOUS | Status: AC
  Administered 2019-08-27: 500 mL via INTRAVENOUS

## 2019-08-27 MED ORDER — SODIUM CHLORIDE 0.9 % IV SOLN
8.0000 mg/h | INTRAVENOUS | Status: DC
  Administered 2019-08-27 – 2019-08-28 (×3): 8 mg/h via INTRAVENOUS
  Filled 2019-08-27 (×4): qty 80

## 2019-08-27 MED ORDER — CLONAZEPAM 0.5 MG PO TABS
0.5000 mg | ORAL_TABLET | Freq: Four times a day (QID) | ORAL | Status: DC | PRN
  Administered 2019-08-27 – 2019-09-05 (×24): 0.5 mg via ORAL
  Filled 2019-08-27 (×24): qty 1

## 2019-08-27 MED ORDER — ACETAMINOPHEN 325 MG PO TABS
650.0000 mg | ORAL_TABLET | Freq: Four times a day (QID) | ORAL | Status: DC | PRN
  Filled 2019-08-27: qty 2

## 2019-08-27 MED ORDER — SODIUM CHLORIDE 0.9 % IV SOLN: 10.0000 mL/h | Freq: Once | INTRAVENOUS | Status: DC

## 2019-08-27 MED ORDER — HYDROMORPHONE HCL 2 MG PO TABS
1.5000 mg | ORAL_TABLET | Freq: Four times a day (QID) | ORAL | Status: DC | PRN
  Administered 2019-08-27 – 2019-08-28 (×3): 2 mg via ORAL
  Filled 2019-08-27 (×3): qty 1

## 2019-08-27 MED ORDER — IOHEXOL 300 MG/ML  SOLN
100.0000 mL | Freq: Once | INTRAMUSCULAR | Status: AC | PRN
  Administered 2019-08-27: 100 mL via INTRAVENOUS

## 2019-08-27 MED ORDER — OXYCODONE-ACETAMINOPHEN 5-325 MG PO TABS
1.0000 | ORAL_TABLET | Freq: Three times a day (TID) | ORAL | Status: DC | PRN
  Administered 2019-08-28 (×2): 1 via ORAL
  Filled 2019-08-27 (×3): qty 1

## 2019-08-27 MED ORDER — ACETAMINOPHEN 650 MG RE SUPP: 650.0000 mg | Freq: Four times a day (QID) | RECTAL | Status: DC | PRN

## 2019-08-27 NOTE — ED Notes (Signed)
2nd unit PRBC infusing at 250 ml/hr., IV site unremarkable , respirations unlabored /vital signs stable , no adverse reaction .
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
## 2019-08-27 NOTE — ED Notes (Signed)
Pt transported to CT ?

## 2019-08-27 NOTE — Plan of Care (Signed)
  Problem: Education: Goal: Knowledge of General Education information will improve Description Including pain rating scale, medication(s)/side effects and non-pharmacologic comfort measures Outcome: Progressing   

## 2019-08-27 NOTE — ED Notes (Signed)
New colostomy bag/wafer flange applied . NS IV bolus and Protonix infusing , IV site intact .
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
## 2019-08-27 NOTE — ED Notes (Signed)
2nd unit PRBC started at 120 ml/hr. IV site intact , VS stable /afebrile , denies pain /respirations unlabored .

## 2019-08-27 NOTE — ED Notes (Signed)
1st unit PRBC infusing with no adverse effect , IV  Site unremarkable , afebrile , rate increased to 250 ml/hr.
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
## 2019-08-27 NOTE — ED Notes (Signed)
2nd unit PRBC completed with no adverse effect , afebrile/respirations unlabored , IV site unremarkable .
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
## 2019-08-27 NOTE — ED Notes (Signed)
2nd unit infusing with no adverse effect , afebrile , respirations unlabored , rate increased to 250 ml/hr.

## 2019-08-27 NOTE — ED Notes (Signed)
1st unit PRBC infusing at 120 ml/hr . , no adverse effect , afebrile /VSS, waiitng for admitting MD , IV site intact.

## 2019-08-27 NOTE — ED Notes (Signed)
1st unit PRBC completed with no adverse effect , VS normal , IV site intact / afebrile .

## 2019-08-27 NOTE — ED Notes (Signed)
Answered questions about pt by father that were relayed to this RN by pt's brother at bedside.

## 2019-08-27 NOTE — Consult Note (Signed)
WOC Nurse Consult Note: Patient receiving care in [facility] ED 004.  Primary RN, Lanora Manis, in room at the time of my assessment. Of note, she was also the patient's nurse during his last ED visit and remembers him well. Reason for Consult: "ostomy and JP drain" Wound type: Abdomen has two healing, partial thickness surgical wounds, one on each side of the abdomen.  See photos from April of this year.  These continue to heal very nicely and are in need of re-epithelialization only.  Both wound beds are pink, without drainage or odor.  I have ordered saline moistened gauze and ABD pads twice daily to these sites.  There is a JP drain that appears to be collecting stool, although I am not certain of this. The drain contents, color, and texture heavily resemble the stool in the colostomy pouch.  I have ordered for soap and water cleansing with split gauze dressing daily to the area.  There is a fecal ostomy in what might be a LLQ of the abdomen.  The abdomen is heavily scarred from previous surgeries and landmarks are therefore askew and difficult to determine. The stoma is pink, moist, measures 1 1/8 inches, round, budded.  I have ordered a one piece pouch and barrier rings, and for staff to cut the opening just large enough to accommodate the stoma. Pressure Injury POA: Yes/No/NA Measurement: Wound bed: Drainage (amount, consistency, odor)  Periwound: Dressing procedure/placement/frequency: Monitor the wound area(s) for worsening of condition such as: Signs/symptoms of infection,  Increase in size,  Development of or worsening of odor, Development of pain, or increased pain at the affected locations.  Notify the medical team if any of these develop.  Thank you for the consult.  Discussed plan of care with the patient and bedside nurse.  WOC nurse will not follow at this time.  Please re-consult the WOC team if needed.  Helmut Muster, RN, MSN, CWOCN, CNS-BC, pager [phone]

## 2019-08-27 NOTE — ED Notes (Signed)
Patient signed consent form for blood transfusion , unable to to access 2nd IV due to minute veins /poorly visible veins .

## 2019-08-28 ENCOUNTER — Encounter (HOSPITAL_COMMUNITY): Payer: Self-pay | Admitting: Internal Medicine

## 2019-08-28 ENCOUNTER — Inpatient Hospital Stay (HOSPITAL_COMMUNITY): Payer: Self-pay | Admitting: Certified Registered Nurse Anesthetist

## 2019-08-28 ENCOUNTER — Encounter (HOSPITAL_COMMUNITY)
Admission: EM | Disposition: A | Discharge status: Home / Self Care | Payer: Self-pay | Source: Home / Self Care | Attending: Internal Medicine

## 2019-08-28 DIAGNOSIS — Z86718 Personal history of other venous thrombosis and embolism: Secondary | ICD-10-CM

## 2019-08-28 DIAGNOSIS — K921 Melena: Secondary | ICD-10-CM

## 2019-08-28 DIAGNOSIS — K2970 Gastritis, unspecified, without bleeding: Secondary | ICD-10-CM

## 2019-08-28 DIAGNOSIS — F200 Paranoid schizophrenia: Secondary | ICD-10-CM

## 2019-08-28 DIAGNOSIS — G8929 Other chronic pain: Secondary | ICD-10-CM

## 2019-08-28 HISTORY — PX: ESOPHAGOGASTRODUODENOSCOPY (EGD) WITH PROPOFOL: SHX5813

## 2019-08-28 LAB — TYPE AND SCREEN
ABO/RH(D): B POS
Antibody Screen: NEGATIVE
Unit division: 0
Unit division: 0

## 2019-08-28 LAB — COMPREHENSIVE METABOLIC PANEL
ALT: 21 U/L (ref 0–44)
AST: 29 U/L (ref 15–41)
Albumin: 1.9 g/dL — ABNORMAL LOW (ref 3.5–5.0)
Alkaline Phosphatase: 266 U/L — ABNORMAL HIGH (ref 38–126)
Anion gap: 9 (ref 5–15)
BUN: 5 mg/dL — ABNORMAL LOW (ref 6–20)
CO2: 22 mmol/L (ref 22–32)
Calcium: 8.1 mg/dL — ABNORMAL LOW (ref 8.9–10.3)
Chloride: 107 mmol/L (ref 98–111)
Creatinine, Ser: 1.18 mg/dL (ref 0.61–1.24)
GFR calc Af Amer: >60 mL/min (ref >60)
GFR calc non Af Amer: >60 mL/min (ref >60)
Glucose, Bld: 111 mg/dL — ABNORMAL HIGH (ref 70–99)
Potassium: 3.6 mmol/L (ref 3.5–5.1)
Sodium: 138 mmol/L (ref 135–145)
Total Bilirubin: 0.8 mg/dL (ref 0.3–1.2)
Total Protein: 6.3 g/dL — ABNORMAL LOW (ref 6.5–8.1)

## 2019-08-28 LAB — URINE CULTURE: Culture: NO GROWTH

## 2019-08-28 LAB — PREALBUMIN: Prealbumin: 5.2 mg/dL — ABNORMAL LOW (ref 18–38)

## 2019-08-28 LAB — CBC
HCT: 28 % — ABNORMAL LOW (ref 39.0–52.0)
Hemoglobin: 8.8 g/dL — ABNORMAL LOW (ref 13.0–17.0)
MCH: 25.8 pg — ABNORMAL LOW (ref 26.0–34.0)
MCHC: 31.4 g/dL (ref 30.0–36.0)
MCV: 82.1 fL (ref 80.0–100.0)
Platelets: 248 10*3/uL (ref 150–400)
RBC: 3.41 MIL/uL — ABNORMAL LOW (ref 4.22–5.81)
RDW: 17.9 % — ABNORMAL HIGH (ref 11.5–15.5)
WBC: 4 10*3/uL (ref 4.0–10.5)
nRBC: 0 % (ref 0.0–0.2)

## 2019-08-28 LAB — BPAM RBC
Blood Product Expiration Date: 202106162359
Blood Product Expiration Date: 202106162359
ISSUE DATE / TIME: 202105200218
ISSUE DATE / TIME: 202105200406
Unit Type and Rh: 7300
Unit Type and Rh: 7300

## 2019-08-28 LAB — HEPATITIS A ANTIBODY, TOTAL: hep A Total Ab: REACTIVE — AB

## 2019-08-28 LAB — HEPATITIS B SURFACE ANTIGEN: Hepatitis B Surface Ag: NONREACTIVE

## 2019-08-28 LAB — HEPATITIS B SURFACE ANTIBODY,QUALITATIVE: Hep B S Ab: NONREACTIVE

## 2019-08-28 LAB — HEPATITIS C ANTIBODY: HCV Ab: NONREACTIVE

## 2019-08-28 SURGERY — ESOPHAGOGASTRODUODENOSCOPY (EGD) WITH PROPOFOL
Anesthesia: Monitor Anesthesia Care

## 2019-08-28 MED ORDER — PROPOFOL 500 MG/50ML IV EMUL
INTRAVENOUS | Status: DC | PRN
  Administered 2019-08-28: 100 ug/kg/min via INTRAVENOUS

## 2019-08-28 MED ORDER — LIDOCAINE 2% (20 MG/ML) 5 ML SYRINGE
INTRAMUSCULAR | Status: DC | PRN
  Administered 2019-08-28: 40 mg via INTRAVENOUS

## 2019-08-28 MED ORDER — PANTOPRAZOLE SODIUM 40 MG IV SOLR
40.0000 mg | Freq: Two times a day (BID) | INTRAVENOUS | Status: DC
  Administered 2019-08-28 – 2019-09-01 (×9): 40 mg via INTRAVENOUS
  Filled 2019-08-28 (×9): qty 40

## 2019-08-28 MED ORDER — PROPOFOL 10 MG/ML IV BOLUS
INTRAVENOUS | Status: DC | PRN
  Administered 2019-08-28 (×2): 100 mg via INTRAVENOUS

## 2019-08-28 MED ORDER — PANTOPRAZOLE SODIUM 40 MG IV SOLR: 40.0000 mg | INTRAVENOUS | Status: DC

## 2019-08-28 MED ORDER — DEXMEDETOMIDINE HCL 200 MCG/2ML IV SOLN
INTRAVENOUS | Status: DC | PRN
  Administered 2019-08-28: 20 ug via INTRAVENOUS

## 2019-08-28 MED ORDER — ENOXAPARIN SODIUM 40 MG/0.4ML ~~LOC~~ SOLN
40.0000 mg | SUBCUTANEOUS | Status: DC
  Administered 2019-08-29: 40 mg via SUBCUTANEOUS
  Filled 2019-08-28: qty 0.4

## 2019-08-28 MED ORDER — LACTATED RINGERS IV SOLN: INTRAVENOUS | Status: DC | PRN

## 2019-08-28 MED ORDER — PANTOPRAZOLE SODIUM 40 MG IV SOLR: 40.0000 mg | Freq: Two times a day (BID) | INTRAVENOUS | Status: DC

## 2019-08-28 SURGICAL SUPPLY — 15 items

## 2019-08-28 NOTE — Anesthesia Procedure Notes (Signed)
Procedure Name: MAC Date/Time: 08/28/2019 8:39 AM Performed by: Lowella Dell, CRNA Pre-anesthesia Checklist: Patient identified, Emergency Drugs available, Suction available, Patient being monitored and Timeout performed Patient Re-evaluated:Patient Re-evaluated prior to induction Oxygen Delivery Method: Nasal cannula Placement Confirmation: positive ETCO2 Dental Injury: Teeth and Oropharynx as per pre-operative assessment
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
## 2019-08-28 NOTE — Progress Notes (Signed)
Pt going for EGD at this time.

## 2019-08-28 NOTE — Op Note (Signed)
[facility] Patient Name: Brandon Moyer Procedure Date : 08/28/2019 MRN: [id number] Attending MD: Rachael Fee , MD Date of Birth: [birth year] CSN: [id number] Age: 29 Admit Type: Inpatient Procedure:                Upper GI endoscopy Indications:              Melena Providers:                Rachael Fee, MD, Glory Rosebush, RN, Arlee Muslim Tech., Technician, Levora Angel, CRNA Referring MD:              Medicines:                Monitored Anesthesia Care Complications:            No immediate complications. Estimated blood loss:                            None. Estimated Blood Loss:     Estimated blood loss: none. Procedure:                Pre-Anesthesia Assessment:                           - Prior to the procedure, a History and Physical                            was performed, and patient medications and                            allergies were reviewed. The patient's tolerance of                            previous anesthesia was also reviewed. The risks                            and benefits of the procedure and the sedation                            options and risks were discussed with the patient.                            All questions were answered, and informed consent                            was obtained. Prior Anticoagulants: The patient has                            taken no previous anticoagulant or antiplatelet                            agents. ASA Grade Assessment: IV - A patient with  severe systemic disease that is a constant threat                            to life. After reviewing the risks and benefits,                            the patient was deemed in satisfactory condition to                            undergo the procedure.                           After obtaining informed consent, the endoscope was                            passed under direct vision. Throughout the                             procedure, the patient's blood pressure, pulse, and                            oxygen saturations were monitored continuously. The                            [id number]-H190 ([id number]) Olympus gastroscope was                            introduced through the mouth, and advanced to the                            jejunum. The upper GI endoscopy was accomplished                            without difficulty. The patient tolerated the                            procedure well. Scope In: Scope Out: Findings:      Relatively small gastric remnant with normal appearing GJ anastomosis       (apparent double limb). J tube internal bumper noted, there are two       ulcers associated with the internal bumper. One ulcer is       circumferential, within the tract that the J tube lays in. This may be       whitish granulation tissue. The other ulcer is underneath the J tube       bumper, linear, no visible vessels, approximately 1cm long.      The exam was otherwise without abnormality. Impression:               - Relatively small gastric remnant with normal                            appearing GJ anastomosis (apparent double limb). J  tube internal bumper noted, there are two ulcers                            associated with the internal bumper. One ulcer is                            circumferential, within the tract that the J tube                            lays in. This may be whitish granulation tissue.                            The other ulcer is underneath the J tube bumper,                            linear, no visible vessels, approximately 1cm long. Recommendation:           - The J tube is causing mucosal ulcerations which                            are likely the source of his melena, drop in Hb. It                            will probably need to repositioned or just removed.                            I will leave that to surgery to  decide.                           - No plans for further GI testing. Please call or                            page with any questions or concerns. Procedure Code(s):        --- Professional ---                           [phone], Esophagogastroduodenoscopy, flexible,                            transoral; diagnostic, including collection of                            specimen(s) by brushing or washing, when performed                            (separate procedure) Diagnosis Code(s):        --- Professional ---                           K29.70, Gastritis, unspecified, without bleeding                           K92.1, Melena (includes Hematochezia) CPT copyright 2019 American Medical Association. All rights reserved. The  codes documented in this report are preliminary and upon coder review may  be revised to meet current compliance requirements. Rachael Fee, MD 08/28/2019 10:01:37 AM This report has been signed electronically. Number of Addenda: 0
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
## 2019-08-28 NOTE — Anesthesia Postprocedure Evaluation (Signed)
Anesthesia Post Note  Patient: Akili Corsetti  Procedure(s) Performed: ESOPHAGOGASTRODUODENOSCOPY (EGD) WITH PROPOFOL (N/A )     Patient location during evaluation: PACU Anesthesia Type: MAC Level of consciousness: awake and alert Pain management: pain level controlled Vital Signs Assessment: post-procedure vital signs reviewed and stable Respiratory status: spontaneous breathing, nonlabored ventilation and respiratory function stable Cardiovascular status: blood pressure returned to baseline and stable Postop Assessment: no apparent nausea or vomiting Anesthetic complications: no    Last Vitals:  Vitals:   08/28/19 1024 08/28/19 1034  BP: (!) 123/48 (!) 90/49  Pulse: 69 63  Resp: 16 15  Temp:    SpO2: 99% 97%    Last Pain:  Vitals:   08/28/19 1034  TempSrc:   PainSc: 0-No pain                 Jarome Matin Mylan Lengyel

## 2019-08-28 NOTE — Progress Notes (Signed)
Pt returned to 2W15 at this time.

## 2019-08-28 NOTE — Transfer of Care (Signed)
Immediate Anesthesia Transfer of Care Note  Patient: Brandon Moyer  Procedure(s) Performed: ESOPHAGOGASTRODUODENOSCOPY (EGD) WITH PROPOFOL (N/A )  Patient Location: PACU  Anesthesia Type:MAC  Level of Consciousness: drowsy and patient cooperative  Airway & Oxygen Therapy: Patient Spontanous Breathing and Patient connected to nasal cannula oxygen  Post-op Assessment: Report given to RN and Post -op Vital signs reviewed and stable  Post vital signs: Reviewed and stable  Last Vitals:  Vitals Value Taken Time  BP 105/38 08/28/19 0957  Temp    Pulse 61 08/28/19 0958  Resp 18 08/28/19 0958  SpO2 100 % 08/28/19 0958  Vitals shown include unvalidated device data.  Last Pain:  Vitals:   08/28/19 0856  TempSrc:   PainSc: 0-No pain         Complications: No apparent anesthesia complications

## 2019-08-28 NOTE — Consult Note (Signed)
WOC Nurse Consult Note: Patient now receiving care in [facility] 2W15.  I saw the patient in the ED 5/20 at 9:31.  Please see orders for the abdominal wounds and ostomy care. Reason for Consult: Wound type: Pressure Injury POA: Yes/No/NA Measurement: Wound bed: Drainage (amount, consistency, odor)  Periwound: Dressing procedure/placement/frequency: I spoke with primary RN, Sarah, and walked her through where to find wound and ostomy care orders.  WOC nurse will not follow at this time.  Please re-consult the WOC team if needed.  Helmut Muster, RN, MSN, CWOCN, CNS-BC, pager [phone]

## 2019-08-28 NOTE — Interval H&P Note (Signed)
History and Physical Interval Note:  08/28/2019 9:00 AM  Brandon Moyer  has presented today for surgery, with the diagnosis of anemia, melena.  The various methods of treatment have been discussed with the patient and family. After consideration of risks, benefits and other options for treatment, the patient has consented to  Procedure(s): ESOPHAGOGASTRODUODENOSCOPY (EGD) WITH PROPOFOL (N/A) as a surgical intervention.  The patient's history has been reviewed, patient examined, no change in status, stable for surgery.  I have reviewed the patient's chart and labs.  Questions were answered to the patient's satisfaction.     Rachael Fee
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
## 2019-08-29 LAB — BASIC METABOLIC PANEL
Anion gap: 4 — ABNORMAL LOW (ref 5–15)
BUN: 5 mg/dL — ABNORMAL LOW (ref 6–20)
CO2: 25 mmol/L (ref 22–32)
Calcium: 8.1 mg/dL — ABNORMAL LOW (ref 8.9–10.3)
Chloride: 108 mmol/L (ref 98–111)
Creatinine, Ser: 1.28 mg/dL — ABNORMAL HIGH (ref 0.61–1.24)
GFR calc Af Amer: >60 mL/min (ref >60)
GFR calc non Af Amer: >60 mL/min (ref >60)
Glucose, Bld: 87 mg/dL (ref 70–99)
Potassium: 3.7 mmol/L (ref 3.5–5.1)
Sodium: 137 mmol/L (ref 135–145)

## 2019-08-29 LAB — CBC
HCT: 25.6 % — ABNORMAL LOW (ref 39.0–52.0)
Hemoglobin: 8.1 g/dL — ABNORMAL LOW (ref 13.0–17.0)
MCH: 26 pg (ref 26.0–34.0)
MCHC: 31.6 g/dL (ref 30.0–36.0)
MCV: 82.1 fL (ref 80.0–100.0)
Platelets: 264 10*3/uL (ref 150–400)
RBC: 3.12 MIL/uL — ABNORMAL LOW (ref 4.22–5.81)
RDW: 18 % — ABNORMAL HIGH (ref 11.5–15.5)
WBC: 4.1 10*3/uL (ref 4.0–10.5)
nRBC: 0 % (ref 0.0–0.2)

## 2019-08-29 MED ORDER — HYDROMORPHONE HCL 1 MG/ML PO LIQD: 1.5000 mg | Freq: Four times a day (QID) | ORAL | Status: DC | PRN

## 2019-08-29 MED ORDER — HYDROMORPHONE HCL 2 MG PO TABS
2.0000 mg | ORAL_TABLET | Freq: Three times a day (TID) | ORAL | Status: DC | PRN
  Administered 2019-08-29 – 2019-09-05 (×17): 2 mg via ORAL
  Filled 2019-08-29 (×17): qty 1

## 2019-08-29 NOTE — Progress Notes (Signed)
1 Day Post-Op   Subjective/Chief Complaint: No complaints   Objective: Vital signs in last 24 hours: Temp:  [97.2 F (36.2 C)-98.8 F (37.1 C)] 97.2 F (36.2 C) (05/22 0803) Pulse Rate:  [49-80] 63 (05/22 0803) Resp:  [14-18] 17 (05/22 0803) BP: (77-123)/(39-71) 97/64 (05/22 0803) SpO2:  [97 %-100 %] 100 % (05/22 0803) Weight:  [77.1 kg] 77.1 kg (05/21 1200) Last BM Date: 08/28/19  Intake/Output from previous day: 05/21 0701 - 05/22 0700 In: 869.8 [P.O.:240; I.V.:509.8] Out: 1405 [Urine:900; Drains:105; Stool:400] Intake/Output this shift: No intake/output data recorded.  General appearance: alert and cooperative Resp: clear to auscultation bilaterally Cardio: regular rate and rhythm GI: soft, ostomy pink and productive. drain output dark brown  Lab Results:  Recent Labs    08/28/19 1250 08/29/19 0441  WBC 4.0 4.1  HGB 8.8* 8.1*  HCT 28.0* 25.6*  PLT 248 264   BMET Recent Labs    08/28/19 1250 08/29/19 0441  NA 138 137  K 3.6 3.7  CL 107 108  CO2 22 25  GLUCOSE 111* 87  BUN 5* 5*  CREATININE 1.18 1.28*  CALCIUM 8.1* 8.1*   PT/INR Recent Labs    08/26/19 2325  LABPROT 15.3*  INR 1.3*   ABG No results for input(s): PHART, HCO3 in the last 72 hours.  Invalid input(s): PCO2, PO2  Studies/Results: No results found.  Anti-infectives: Anti-infectives (From admission, onward)   None      Assessment/Plan: s/p Procedure(s): ESOPHAGOGASTRODUODENOSCOPY (EGD) WITH PROPOFOL (N/A) Advance diet  Hg down slightly but stable. Endo showed ulcer at j tube site which may be site of recent GI bleed Would continue to hold blood thinner No new recs Will follow  LOS: 2 days    Chevis Pretty III 08/29/2019
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
## 2019-08-29 NOTE — Progress Notes (Signed)
OT Cancellation Note  Patient Details Name: Brandon Moyer MRN: [id number] DOB: 1991/02/09   Cancelled Treatment:    Reason Eval/Treat Not Completed: Other (comment)(Pt ostomy bag not adhering to skin, awaiting replacement. Will continue to follow as available and appropriate.)  Dalphine Handing, MSOT, OTR/L Acute Rehabilitation Services [facility] Office Number: [phone] Pager: [phone]  Dalphine Handing 08/29/2019, 3:02 PM
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
## 2019-08-30 DIAGNOSIS — F321 Major depressive disorder, single episode, moderate: Secondary | ICD-10-CM

## 2019-08-30 LAB — BASIC METABOLIC PANEL
Anion gap: 6 (ref 5–15)
BUN: <5 mg/dL — ABNORMAL LOW (ref 6–20)
CO2: 21 mmol/L — ABNORMAL LOW (ref 22–32)
Calcium: 8.2 mg/dL — ABNORMAL LOW (ref 8.9–10.3)
Chloride: 110 mmol/L (ref 98–111)
Creatinine, Ser: 1.27 mg/dL — ABNORMAL HIGH (ref 0.61–1.24)
GFR calc Af Amer: >60 mL/min (ref >60)
GFR calc non Af Amer: >60 mL/min (ref >60)
Glucose, Bld: 128 mg/dL — ABNORMAL HIGH (ref 70–99)
Potassium: 3.5 mmol/L (ref 3.5–5.1)
Sodium: 137 mmol/L (ref 135–145)

## 2019-08-30 LAB — CBC
HCT: 29.6 % — ABNORMAL LOW (ref 39.0–52.0)
Hemoglobin: 9.1 g/dL — ABNORMAL LOW (ref 13.0–17.0)
MCH: 25.8 pg — ABNORMAL LOW (ref 26.0–34.0)
MCHC: 30.7 g/dL (ref 30.0–36.0)
MCV: 83.9 fL (ref 80.0–100.0)
Platelets: 304 10*3/uL (ref 150–400)
RBC: 3.53 MIL/uL — ABNORMAL LOW (ref 4.22–5.81)
RDW: 18.1 % — ABNORMAL HIGH (ref 11.5–15.5)
WBC: 4.5 10*3/uL (ref 4.0–10.5)
nRBC: 0 % (ref 0.0–0.2)

## 2019-08-30 MED ORDER — ENOXAPARIN SODIUM 40 MG/0.4ML ~~LOC~~ SOLN
40.0000 mg | SUBCUTANEOUS | Status: DC
  Administered 2019-08-30 – 2019-09-04 (×6): 40 mg via SUBCUTANEOUS
  Filled 2019-08-30 (×7): qty 0.4

## 2019-08-30 MED ORDER — ESCITALOPRAM OXALATE 10 MG PO TABS
5.0000 mg | ORAL_TABLET | Freq: Every day | ORAL | Status: DC
  Administered 2019-08-30: 5 mg via ORAL
  Filled 2019-08-30: qty 1

## 2019-08-30 MED ORDER — OLANZAPINE 5 MG PO TABS: 5.0000 mg | ORAL_TABLET | Freq: Two times a day (BID) | ORAL | Status: DC
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
## 2019-08-31 LAB — GLUCOSE, CAPILLARY
Glucose-Capillary: 147 mg/dL — ABNORMAL HIGH (ref 70–99)
Glucose-Capillary: 265 mg/dL — ABNORMAL HIGH (ref 70–99)

## 2019-08-31 LAB — COMPREHENSIVE METABOLIC PANEL
ALT: 12 U/L (ref 0–44)
AST: 15 U/L (ref 15–41)
Albumin: 1.7 g/dL — ABNORMAL LOW (ref 3.5–5.0)
Alkaline Phosphatase: 199 U/L — ABNORMAL HIGH (ref 38–126)
Anion gap: 5 (ref 5–15)
BUN: 6 mg/dL (ref 6–20)
CO2: 23 mmol/L (ref 22–32)
Calcium: 7.9 mg/dL — ABNORMAL LOW (ref 8.9–10.3)
Chloride: 111 mmol/L (ref 98–111)
Creatinine, Ser: 1.15 mg/dL (ref 0.61–1.24)
GFR calc Af Amer: >60 mL/min (ref >60)
GFR calc non Af Amer: >60 mL/min (ref >60)
Glucose, Bld: 86 mg/dL (ref 70–99)
Potassium: 3.5 mmol/L (ref 3.5–5.1)
Sodium: 139 mmol/L (ref 135–145)
Total Bilirubin: 0.7 mg/dL (ref 0.3–1.2)
Total Protein: 6 g/dL — ABNORMAL LOW (ref 6.5–8.1)

## 2019-08-31 LAB — CBC
HCT: 28.9 % — ABNORMAL LOW (ref 39.0–52.0)
Hemoglobin: 8.8 g/dL — ABNORMAL LOW (ref 13.0–17.0)
MCH: 26 pg (ref 26.0–34.0)
MCHC: 30.4 g/dL (ref 30.0–36.0)
MCV: 85.3 fL (ref 80.0–100.0)
Platelets: 283 10*3/uL (ref 150–400)
RBC: 3.39 MIL/uL — ABNORMAL LOW (ref 4.22–5.81)
RDW: 18.3 % — ABNORMAL HIGH (ref 11.5–15.5)
WBC: 4 10*3/uL (ref 4.0–10.5)
nRBC: 0 % (ref 0.0–0.2)

## 2019-08-31 MED ORDER — FLUOXETINE HCL 20 MG PO CAPS
40.0000 mg | ORAL_CAPSULE | Freq: Two times a day (BID) | ORAL | Status: DC
  Administered 2019-09-01 – 2019-09-05 (×9): 40 mg via ORAL
  Filled 2019-08-31 (×9): qty 2

## 2019-08-31 MED ORDER — POTASSIUM CHLORIDE CRYS ER 20 MEQ PO TBCR
20.0000 meq | EXTENDED_RELEASE_TABLET | Freq: Every day | ORAL | Status: DC
  Administered 2019-08-31 – 2019-09-02 (×3): 20 meq via ORAL
  Filled 2019-08-31 (×3): qty 1

## 2019-08-31 MED ORDER — ESCITALOPRAM OXALATE 10 MG PO TABS
10.0000 mg | ORAL_TABLET | Freq: Every day | ORAL | Status: DC
  Administered 2019-08-31: 10 mg via ORAL
  Filled 2019-08-31: qty 1

## 2019-08-31 MED ORDER — TRAZODONE HCL 50 MG PO TABS
50.0000 mg | ORAL_TABLET | Freq: Every day | ORAL | Status: DC
  Administered 2019-08-31 – 2019-09-04 (×5): 50 mg via ORAL
  Filled 2019-08-31 (×5): qty 1
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
## 2019-08-31 NOTE — Progress Notes (Signed)
PT Cancellation Note  Patient Details Name: Brandon Moyer MRN: [id number] DOB: Mar 22, 1991   Cancelled Treatment:    Reason Eval/Treat Not Completed: Other (comment). Pt in recliner, reporting he has already walked in hallway this morning. Declining further ambulation or participation in PT at this time. PT to re-attempt as time allows.   Ilda Foil 08/31/2019, 12:20 PM   Aida Raider, PT  Office # [phone] Pager [phone]
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
## 2019-09-01 DIAGNOSIS — K632 Fistula of intestine: Secondary | ICD-10-CM

## 2019-09-01 LAB — BASIC METABOLIC PANEL
Anion gap: 8 (ref 5–15)
BUN: 5 mg/dL — ABNORMAL LOW (ref 6–20)
CO2: 21 mmol/L — ABNORMAL LOW (ref 22–32)
Calcium: 8.2 mg/dL — ABNORMAL LOW (ref 8.9–10.3)
Chloride: 111 mmol/L (ref 98–111)
Creatinine, Ser: 1.33 mg/dL — ABNORMAL HIGH (ref 0.61–1.24)
GFR calc Af Amer: >60 mL/min (ref >60)
GFR calc non Af Amer: >60 mL/min (ref >60)
Glucose, Bld: 129 mg/dL — ABNORMAL HIGH (ref 70–99)
Potassium: 3.9 mmol/L (ref 3.5–5.1)
Sodium: 140 mmol/L (ref 135–145)

## 2019-09-01 LAB — CBC
HCT: 30.1 % — ABNORMAL LOW (ref 39.0–52.0)
Hemoglobin: 9.2 g/dL — ABNORMAL LOW (ref 13.0–17.0)
MCH: 25.6 pg — ABNORMAL LOW (ref 26.0–34.0)
MCHC: 30.6 g/dL (ref 30.0–36.0)
MCV: 83.8 fL (ref 80.0–100.0)
Platelets: 310 10*3/uL (ref 150–400)
RBC: 3.59 MIL/uL — ABNORMAL LOW (ref 4.22–5.81)
RDW: 17.9 % — ABNORMAL HIGH (ref 11.5–15.5)
WBC: 4 10*3/uL (ref 4.0–10.5)
nRBC: 0 % (ref 0.0–0.2)

## 2019-09-01 MED ORDER — PANTOPRAZOLE SODIUM 40 MG PO TBEC
40.0000 mg | DELAYED_RELEASE_TABLET | Freq: Every day | ORAL | Status: DC
  Administered 2019-09-02 – 2019-09-05 (×4): 40 mg via ORAL
  Filled 2019-09-01 (×4): qty 1
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
## 2019-09-02 ENCOUNTER — Inpatient Hospital Stay (HOSPITAL_COMMUNITY): Payer: Self-pay

## 2019-09-02 DIAGNOSIS — D649 Anemia, unspecified: Secondary | ICD-10-CM

## 2019-09-02 DIAGNOSIS — F321 Major depressive disorder, single episode, moderate: Secondary | ICD-10-CM

## 2019-09-02 HISTORY — PX: IR SINUS/FIST TUBE CHK-NON GI: IMG673

## 2019-09-02 LAB — CBC
HCT: 31.6 % — ABNORMAL LOW (ref 39.0–52.0)
Hemoglobin: 9.7 g/dL — ABNORMAL LOW (ref 13.0–17.0)
MCH: 25.7 pg — ABNORMAL LOW (ref 26.0–34.0)
MCHC: 30.7 g/dL (ref 30.0–36.0)
MCV: 83.8 fL (ref 80.0–100.0)
Platelets: 288 10*3/uL (ref 150–400)
RBC: 3.77 MIL/uL — ABNORMAL LOW (ref 4.22–5.81)
RDW: 17.8 % — ABNORMAL HIGH (ref 11.5–15.5)
WBC: 4.3 10*3/uL (ref 4.0–10.5)
nRBC: 0 % (ref 0.0–0.2)

## 2019-09-02 MED ORDER — IOHEXOL 300 MG/ML  SOLN
50.0000 mL | Freq: Once | INTRAMUSCULAR | Status: AC | PRN
  Administered 2019-09-02: 30 mL
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
## 2019-09-02 NOTE — Plan of Care (Signed)

## 2019-09-02 NOTE — Plan of Care (Signed)
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
## 2019-09-02 NOTE — Progress Notes (Signed)
[facility] paged MEWs yellow bp 89/66 HR 101 rechecked 101/66 HR 76 MEWS green. Ilean Skill LPN

## 2019-09-03 LAB — CBC
HCT: 33.5 % — ABNORMAL LOW (ref 39.0–52.0)
Hemoglobin: 10.2 g/dL — ABNORMAL LOW (ref 13.0–17.0)
MCH: 25.4 pg — ABNORMAL LOW (ref 26.0–34.0)
MCHC: 30.4 g/dL (ref 30.0–36.0)
MCV: 83.3 fL (ref 80.0–100.0)
Platelets: 343 10*3/uL (ref 150–400)
RBC: 4.02 MIL/uL — ABNORMAL LOW (ref 4.22–5.81)
RDW: 17.6 % — ABNORMAL HIGH (ref 11.5–15.5)
WBC: 6.8 10*3/uL (ref 4.0–10.5)
nRBC: 0 % (ref 0.0–0.2)

## 2019-09-03 LAB — BASIC METABOLIC PANEL
Anion gap: 8 (ref 5–15)
BUN: 8 mg/dL (ref 6–20)
CO2: 18 mmol/L — ABNORMAL LOW (ref 22–32)
Calcium: 8.1 mg/dL — ABNORMAL LOW (ref 8.9–10.3)
Chloride: 109 mmol/L (ref 98–111)
Creatinine, Ser: 1.76 mg/dL — ABNORMAL HIGH (ref 0.61–1.24)
GFR calc Af Amer: 59 mL/min — ABNORMAL LOW (ref >60)
GFR calc non Af Amer: 51 mL/min — ABNORMAL LOW (ref >60)
Glucose, Bld: 86 mg/dL (ref 70–99)
Potassium: 3.3 mmol/L — ABNORMAL LOW (ref 3.5–5.1)
Sodium: 135 mmol/L (ref 135–145)

## 2019-09-03 MED ORDER — POTASSIUM CHLORIDE CRYS ER 20 MEQ PO TBCR
40.0000 meq | EXTENDED_RELEASE_TABLET | Freq: Every day | ORAL | Status: DC
  Administered 2019-09-03 – 2019-09-05 (×3): 40 meq via ORAL
  Filled 2019-09-03 (×3): qty 2

## 2019-09-03 NOTE — Progress Notes (Signed)
Called to pt room to observe copious amounts of yellowish-brown drainage coming from a new wound site on patient's right lateral flank. Applied a gauze bandage and notified MD of findings. Wound consult placed. Patient currently afebrile and resting comfortably. Will continue to monitor.

## 2019-09-03 NOTE — Discharge Instructions (Addendum)
Please stay well hydrated at home. Drink plenty of fluids for next 4-5 days.  High-Protein and High-Calorie Diet Eating high-protein and high-calorie foods can help you to gain weight, heal after an injury, and recover after an illness or surgery. The specific amount of daily protein and calories you need depends on:  Your body weight.  The reason this diet is recommended for you. What is my plan? Generally, a high-protein, high-calorie diet involves:  Eating 250-500 extra calories each day.  Making sure that you get enough of your daily calories from protein. Ask your health care provider how many of your calories should come from protein. Talk with a health care provider, such as a diet and nutrition specialist (dietitian), about how much protein and how many calories you need each day. Follow the diet as directed by your health care provider. What are tips for following this plan?  Preparing meals  Add whole milk, half-and-half, or heavy cream to cereal, pudding, soup, or hot cocoa.  Add whole milk to instant breakfast drinks.  Add peanut butter to oatmeal or smoothies.  Add powdered milk to baked goods, smoothies, or milkshakes.  Add powdered milk, cream, or butter to mashed potatoes.  Add cheese to cooked vegetables.  Make whole-milk yogurt parfaits. Top them with granola, fruit, or nuts.  Add cottage cheese to your fruit.  Add avocado, cheese, or both to sandwiches or salads.  Add meat, poultry, or seafood to rice, pasta, casseroles, salads, and soups.  Use mayonnaise when making egg salad, chicken salad, or tuna salad.  Use peanut butter as a dip for vegetables or as a topping for pretzels, celery, or crackers.  Add beans to casseroles, dips, and spreads.  Add pureed beans to sauces and soups.  Replace calorie-free drinks with calorie-containing drinks, such as milk and fruit juice.  Replace water with milk or heavy cream when making foods such as oatmeal,  pudding, or cocoa. General instructions  Ask your health care provider if you should take a nutritional supplement.  Try to eat six small meals each day instead of three large meals.  Eat a balanced diet. In each meal, include one food that is high in protein.  Keep nutritious snacks available, such as nuts, trail mixes, dried fruit, and yogurt.  If you have kidney disease or diabetes, talk with your health care provider about how much protein is safe for you. Too much protein may put extra stress on your kidneys.  Drink your calories. Choose high-calorie drinks and have them after your meals. What high-protein foods should I eat?  Vegetables Soybeans. Peas. Grains Quinoa. Bulgur wheat. Meats and other proteins Beef, pork, and poultry. Fish and seafood. Eggs. Tofu. Textured vegetable protein (TVP). Peanut butter. Nuts and seeds. Dried beans. Protein powders. Dairy Whole milk. Whole-milk yogurt. Powdered milk. Cheese. [employer]. Eggnog. Beverages High-protein supplement drinks. Soy milk. Other foods Protein bars. The items listed above may not be a complete list of high-protein foods and beverages. Contact a dietitian for more options. What high-calorie foods should I eat? Fruits Dried fruit. Fruit leather. Canned fruit in syrup. Fruit juice. Avocado. Vegetables Vegetables cooked in oil or butter. Fried potatoes. Grains Pasta. Quick breads. Muffins. Pancakes. Ready-to-eat cereal. Meats and other proteins Peanut butter. Nuts and seeds. Dairy Heavy cream. Whipped cream. Cream cheese. Sour cream. Ice cream. Custard. Pudding. Beverages Meal-replacement beverages. Nutrition shakes. Fruit juice. Sugar-sweetened soft drinks. Seasonings and condiments Salad dressing. Mayonnaise. Alfredo sauce. Fruit preserves or jelly. Honey. Syrup. Sweets  and desserts Cake. Cookies. Pie. Pastries. Candy bars. Chocolate. Fats and oils Butter or margarine. Oil. Gravy. Other  foods Meal-replacement bars. The items listed above may not be a complete list of high-calorie foods and beverages. Contact a dietitian for more options. Summary  A high-protein, high-calorie diet can help you gain weight or heal faster after an injury, illness, or surgery.  To increase your protein and calories, add ingredients such as whole milk, peanut butter, cheese, beans, meat, or seafood to meal items.  To get enough extra calories each day, include high-calorie foods and beverages at each meal.  Adding a high-calorie drink or shake can be an easy way to help you get enough calories each day. Talk with your healthcare provider or dietitian about the best options for you. This information is not intended to replace advice given to you by your health care provider. Make sure you discuss any questions you have with your health care provider. Document Revised: 03/08/2017 Document Reviewed: 02/05/2017 Elsevier Patient Education  2020 [employer].

## 2019-09-03 NOTE — TOC CAGE-AID Note (Signed)
Transition of Care [facility]) - CAGE-AID Screening   Patient Details  Name: Brandon Moyer MRN: [id number] Date of Birth: 18-Mar-1991  [facility] of [facility]) CM/SW Contact:    Jimmy Picket, LCSWA Phone Number: 09/03/2019, 2:17 PM   Clinical Narrative:  Pt refused assessment.  CAGE-AID Screening: Substance Abuse Screening unable to be completed due to: : Patient Refused                   Jimmy Picket, Bryon Lions Clinical Social Worker [phone]

## 2019-09-03 NOTE — Consult Note (Signed)
WOC Nurse Consult Note: Reason for Consult: Right lower quadrant open wound. LLQ ileostomy Wound type: chronic nonhealing wound Pressure Injury POA: NA Measurement: RLQ nonhealing wound Wound DDU:KGUR pink and moist Drainage (amount, consistency, odor) minimal serosanguinous no odor.  Periwound: JP drain Dressing procedure/placement/frequency: Cleanse wound to right lower abdomen with NS.  Apply mupirocin to wound bed and cover with dry dressing and tape daily.   WOC Nurse ostomy consult note Stoma type/location: LLQ colostomy Stomal assessment/size: 1 "  Peristomal assessment: pouch intact Treatment options for stomal/peristomal skin: barrier ring Output liquid brown stool Ostomy pouching: 1pc. Convex pouch with barrier ring LAWSON # [id number] pouch and barrier ring # [id number] Education provided: none needed Enrolled patient in [employer] DC program: Yes previously Will not follow at this time.  Please re-consult if needed.  Maple Hudson MSN, RN, FNP-BC CWON Wound, Ostomy, Continence Nurse Pager [phone]
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
## 2019-09-03 NOTE — Progress Notes (Signed)
MD notified of HR on ambulation, no orders received.

## 2019-09-03 NOTE — Progress Notes (Signed)
   09/03/19 1648  Vitals  Temp 97.8 F (36.6 C)  Temp Source Oral  BP 94/68  BP Location Left Arm  BP Method Automatic  Patient Position (if appropriate) Sitting  Pulse Rate (!) 108  Resp 16  Oxygen Therapy  SpO2 100 %  O2 Device Room Air  Pain Assessment  Pain Scale 0-10  Pain Score 0  Complaints & Interventions  Complains of  (nothing)  PCA/Epidural/Spinal Assessment  Respiratory Pattern Regular  MEWS Score  MEWS Temp 0  MEWS Systolic 1  MEWS Pulse 1  MEWS RR 0  MEWS LOC 0  MEWS Score 2  MEWS Score Color Yellow  Note  Observations Patient just completed physical therapy    Call from central telemetry that patient's HR elevated while participating in physical therapy. Patient performing exercises in room and then walking in hall, denying pain or dyspnea. Patient assessed after physical therapy, denies pain or SOB, VS as documented.
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
## 2019-09-03 NOTE — Plan of Care (Signed)
  Problem: Activity: Goal: Risk for activity intolerance will decrease Outcome: Progressing  Patient ambulating in hall with physical therapy and then with staff, patient states, "I feel great" and denies SOB despite elevated HR which was reported to MD.  Problem: Pain Managment: Goal: General experience of comfort will improve Outcome: Progressing  Patient states pain is controlled with medications available PRN.
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
## 2019-09-04 ENCOUNTER — Inpatient Hospital Stay (HOSPITAL_COMMUNITY): Payer: Self-pay

## 2019-09-04 DIAGNOSIS — N179 Acute kidney failure, unspecified: Secondary | ICD-10-CM

## 2019-09-04 LAB — BASIC METABOLIC PANEL
Anion gap: 3 — ABNORMAL LOW (ref 5–15)
BUN: 10 mg/dL (ref 6–20)
CO2: 23 mmol/L (ref 22–32)
Calcium: 8.2 mg/dL — ABNORMAL LOW (ref 8.9–10.3)
Chloride: 112 mmol/L — ABNORMAL HIGH (ref 98–111)
Creatinine, Ser: 2.21 mg/dL — ABNORMAL HIGH (ref 0.61–1.24)
GFR calc Af Amer: 45 mL/min — ABNORMAL LOW (ref >60)
GFR calc non Af Amer: 39 mL/min — ABNORMAL LOW (ref >60)
Glucose, Bld: 90 mg/dL (ref 70–99)
Potassium: 3.6 mmol/L (ref 3.5–5.1)
Sodium: 138 mmol/L (ref 135–145)

## 2019-09-04 LAB — MAGNESIUM: Magnesium: 1.9 mg/dL (ref 1.7–2.4)

## 2019-09-04 MED ORDER — SODIUM CHLORIDE 0.9 % IV BOLUS
500.0000 mL | Freq: Once | INTRAVENOUS | Status: AC
  Administered 2019-09-04: 500 mL via INTRAVENOUS

## 2019-09-04 MED ORDER — SODIUM CHLORIDE 0.9 % IV SOLN: INTRAVENOUS | Status: AC
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
## 2019-09-04 NOTE — Consult Note (Signed)
WOC Nurse Consult Note: Reason for Consult:Fistula right posterior flank.  Feculent effluent present.  Consulted to pouch this area to better track output. THis is located in a deep crease created by trauma wounds and scarring.  Wound type: fistula Pressure Injury POA: NA Measurement:0.4 cm opening draining stool Wound bed:not visible Drainage (amount, consistency, odor) stool Periwound:tender from prolonged exposure to effluent  Dressing procedure/placement/frequency:Cleanse with soap and watera  Apply 1/2 barrier ring around fistula opening.  Apply 1 piece flat (most flexible) (LAWSON # [id number]) -no need to cut the opening. Change weekly and PRN leaks Will not follow at this time.  Please re-consult if needed.  Maple Hudson MSN, RN, FNP-BC CWON Wound, Ostomy, Continence Nurse Pager [phone]

## 2019-09-04 NOTE — Progress Notes (Signed)
Ostomy dressing changed on right lower back because dressing loose from purulent drainage.
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
## 2019-09-05 LAB — BASIC METABOLIC PANEL
Anion gap: 10 (ref 5–15)
BUN: 10 mg/dL (ref 6–20)
CO2: 15 mmol/L — ABNORMAL LOW (ref 22–32)
Calcium: 8.5 mg/dL — ABNORMAL LOW (ref 8.9–10.3)
Chloride: 112 mmol/L — ABNORMAL HIGH (ref 98–111)
Creatinine, Ser: 1.87 mg/dL — ABNORMAL HIGH (ref 0.61–1.24)
GFR calc Af Amer: 55 mL/min — ABNORMAL LOW (ref >60)
GFR calc non Af Amer: 47 mL/min — ABNORMAL LOW (ref >60)
Glucose, Bld: 87 mg/dL (ref 70–99)
Potassium: 4.1 mmol/L (ref 3.5–5.1)
Sodium: 137 mmol/L (ref 135–145)

## 2019-09-05 LAB — CBC
HCT: 34.1 % — ABNORMAL LOW (ref 39.0–52.0)
Hemoglobin: 10.4 g/dL — ABNORMAL LOW (ref 13.0–17.0)
MCH: 25.8 pg — ABNORMAL LOW (ref 26.0–34.0)
MCHC: 30.5 g/dL (ref 30.0–36.0)
MCV: 84.6 fL (ref 80.0–100.0)
Platelets: 480 10*3/uL — ABNORMAL HIGH (ref 150–400)
RBC: 4.03 MIL/uL — ABNORMAL LOW (ref 4.22–5.81)
RDW: 17.7 % — ABNORMAL HIGH (ref 11.5–15.5)
WBC: 6.7 10*3/uL (ref 4.0–10.5)
nRBC: 0 % (ref 0.0–0.2)

## 2019-09-05 MED ORDER — SODIUM BICARBONATE 650 MG PO TABS
650.0000 mg | ORAL_TABLET | Freq: Two times a day (BID) | ORAL | 0 refills | Status: DC
Start: 2019-09-05 — End: 2019-09-17

## 2019-09-05 MED ORDER — CLONAZEPAM 0.5 MG PO TABS
0.5000 mg | ORAL_TABLET | Freq: Two times a day (BID) | ORAL | 0 refills | Status: DC | PRN
Start: 2019-09-05 — End: 2019-11-12

## 2019-09-05 MED ORDER — GABAPENTIN 300 MG PO CAPS: 300.0000 mg | ORAL_CAPSULE | Freq: Three times a day (TID) | ORAL | 0 refills | Status: DC
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
## 2019-09-05 NOTE — Plan of Care (Signed)

## 2019-09-15 ENCOUNTER — Encounter (HOSPITAL_COMMUNITY): Payer: Self-pay | Admitting: Emergency Medicine

## 2019-09-15 ENCOUNTER — Observation Stay (HOSPITAL_COMMUNITY)
Admission: EM | Admit: 2019-09-15 | Discharge: 2019-09-17 | Disposition: A | Discharge status: Home / Self Care | Payer: Self-pay | Attending: Internal Medicine | Admitting: Internal Medicine

## 2019-09-15 ENCOUNTER — Other Ambulatory Visit: Payer: Self-pay

## 2019-09-15 DIAGNOSIS — N179 Acute kidney failure, unspecified: Secondary | ICD-10-CM | POA: Insufficient documentation

## 2019-09-15 DIAGNOSIS — G8929 Other chronic pain: Secondary | ICD-10-CM | POA: Insufficient documentation

## 2019-09-15 DIAGNOSIS — K651 Peritoneal abscess: Secondary | ICD-10-CM | POA: Diagnosis present

## 2019-09-15 DIAGNOSIS — T8143XA Infection following a procedure, organ and space surgical site, initial encounter: Secondary | ICD-10-CM | POA: Diagnosis present

## 2019-09-15 DIAGNOSIS — Z880 Allergy status to penicillin: Secondary | ICD-10-CM | POA: Insufficient documentation

## 2019-09-15 DIAGNOSIS — R569 Unspecified convulsions: Secondary | ICD-10-CM | POA: Insufficient documentation

## 2019-09-15 DIAGNOSIS — Z79899 Other long term (current) drug therapy: Secondary | ICD-10-CM | POA: Insufficient documentation

## 2019-09-15 DIAGNOSIS — F200 Paranoid schizophrenia: Secondary | ICD-10-CM | POA: Insufficient documentation

## 2019-09-15 DIAGNOSIS — J45909 Unspecified asthma, uncomplicated: Secondary | ICD-10-CM | POA: Insufficient documentation

## 2019-09-15 DIAGNOSIS — Z9049 Acquired absence of other specified parts of digestive tract: Secondary | ICD-10-CM | POA: Insufficient documentation

## 2019-09-15 DIAGNOSIS — Z20822 Contact with and (suspected) exposure to covid-19: Secondary | ICD-10-CM | POA: Insufficient documentation

## 2019-09-15 DIAGNOSIS — X58XXXA Exposure to other specified factors, initial encounter: Secondary | ICD-10-CM | POA: Insufficient documentation

## 2019-09-15 DIAGNOSIS — Z905 Acquired absence of kidney: Secondary | ICD-10-CM | POA: Insufficient documentation

## 2019-09-15 DIAGNOSIS — K632 Fistula of intestine: Secondary | ICD-10-CM | POA: Insufficient documentation

## 2019-09-15 DIAGNOSIS — D509 Iron deficiency anemia, unspecified: Secondary | ICD-10-CM | POA: Insufficient documentation

## 2019-09-15 DIAGNOSIS — Z86718 Personal history of other venous thrombosis and embolism: Secondary | ICD-10-CM | POA: Insufficient documentation

## 2019-09-15 DIAGNOSIS — K8689 Other specified diseases of pancreas: Secondary | ICD-10-CM | POA: Insufficient documentation

## 2019-09-15 DIAGNOSIS — T83128A Displacement of other urinary devices and implants, initial encounter: Principal | ICD-10-CM | POA: Insufficient documentation

## 2019-09-15 DIAGNOSIS — F17210 Nicotine dependence, cigarettes, uncomplicated: Secondary | ICD-10-CM | POA: Insufficient documentation

## 2019-09-15 LAB — CBC
HCT: 32.3 % — ABNORMAL LOW (ref 39.0–52.0)
Hemoglobin: 9.8 g/dL — ABNORMAL LOW (ref 13.0–17.0)
MCH: 25 pg — ABNORMAL LOW (ref 26.0–34.0)
MCHC: 30.3 g/dL (ref 30.0–36.0)
MCV: 82.4 fL (ref 80.0–100.0)
Platelets: 370 10*3/uL (ref 150–400)
RBC: 3.92 MIL/uL — ABNORMAL LOW (ref 4.22–5.81)
RDW: 18.5 % — ABNORMAL HIGH (ref 11.5–15.5)
WBC: 5.4 10*3/uL (ref 4.0–10.5)
nRBC: 0 % (ref 0.0–0.2)

## 2019-09-15 LAB — LIPASE, BLOOD: Lipase: 32 U/L (ref 11–51)

## 2019-09-15 LAB — COMPREHENSIVE METABOLIC PANEL
ALT: 15 U/L (ref 0–44)
AST: 34 U/L (ref 15–41)
Albumin: 2.4 g/dL — ABNORMAL LOW (ref 3.5–5.0)
Alkaline Phosphatase: 405 U/L — ABNORMAL HIGH (ref 38–126)
Anion gap: 7 (ref 5–15)
BUN: 12 mg/dL (ref 6–20)
CO2: 22 mmol/L (ref 22–32)
Calcium: 8.8 mg/dL — ABNORMAL LOW (ref 8.9–10.3)
Chloride: 107 mmol/L (ref 98–111)
Creatinine, Ser: 1.79 mg/dL — ABNORMAL HIGH (ref 0.61–1.24)
GFR calc Af Amer: 58 mL/min — ABNORMAL LOW (ref >60)
GFR calc non Af Amer: 50 mL/min — ABNORMAL LOW (ref >60)
Glucose, Bld: 103 mg/dL — ABNORMAL HIGH (ref 70–99)
Potassium: 3.7 mmol/L (ref 3.5–5.1)
Sodium: 136 mmol/L (ref 135–145)
Total Bilirubin: 0.6 mg/dL (ref 0.3–1.2)
Total Protein: 8.3 g/dL — ABNORMAL HIGH (ref 6.5–8.1)

## 2019-09-15 MED ORDER — SODIUM CHLORIDE 0.9% FLUSH
3.0000 mL | Freq: Once | INTRAVENOUS | Status: AC
  Administered 2019-09-16: 3 mL via INTRAVENOUS

## 2019-09-15 NOTE — ED Triage Notes (Addendum)
Pt reports his JP drain came out.  Pt appears very groggy in triage.  Pt reports abdominal pain that started today

## 2019-09-16 ENCOUNTER — Observation Stay (HOSPITAL_COMMUNITY): Payer: Medicaid Other

## 2019-09-16 ENCOUNTER — Emergency Department (HOSPITAL_COMMUNITY): Payer: Medicaid Other

## 2019-09-16 ENCOUNTER — Encounter (HOSPITAL_COMMUNITY): Payer: Self-pay | Admitting: Emergency Medicine

## 2019-09-16 DIAGNOSIS — T8143XA Infection following a procedure, organ and space surgical site, initial encounter: Secondary | ICD-10-CM | POA: Diagnosis present

## 2019-09-16 DIAGNOSIS — K632 Fistula of intestine: Secondary | ICD-10-CM

## 2019-09-16 HISTORY — PX: IR CATHETER TUBE CHANGE: IMG717

## 2019-09-16 LAB — URINALYSIS, ROUTINE W REFLEX MICROSCOPIC
Bacteria, UA: NONE SEEN
Bilirubin Urine: NEGATIVE
Glucose, UA: NEGATIVE mg/dL
Hgb urine dipstick: NEGATIVE
Ketones, ur: NEGATIVE mg/dL
Leukocytes,Ua: NEGATIVE
Nitrite: NEGATIVE
Protein, ur: 100 mg/dL — AB
Specific Gravity, Urine: 1.024 (ref 1.005–1.030)
pH: 6 (ref 5.0–8.0)

## 2019-09-16 LAB — BASIC METABOLIC PANEL
Anion gap: 6 (ref 5–15)
BUN: 14 mg/dL (ref 6–20)
CO2: 24 mmol/L (ref 22–32)
Calcium: 8.6 mg/dL — ABNORMAL LOW (ref 8.9–10.3)
Chloride: 105 mmol/L (ref 98–111)
Creatinine, Ser: 1.98 mg/dL — ABNORMAL HIGH (ref 0.61–1.24)
GFR calc Af Amer: 51 mL/min — ABNORMAL LOW (ref >60)
GFR calc non Af Amer: 44 mL/min — ABNORMAL LOW (ref >60)
Glucose, Bld: 91 mg/dL (ref 70–99)
Potassium: 3.6 mmol/L (ref 3.5–5.1)
Sodium: 135 mmol/L (ref 135–145)

## 2019-09-16 LAB — RAPID URINE DRUG SCREEN, HOSP PERFORMED
Amphetamines: NOT DETECTED
Barbiturates: NOT DETECTED
Benzodiazepines: NOT DETECTED
Cocaine: NOT DETECTED
Opiates: POSITIVE — AB
Tetrahydrocannabinol: NOT DETECTED

## 2019-09-16 LAB — CBC
HCT: 31.8 % — ABNORMAL LOW (ref 39.0–52.0)
Hemoglobin: 9.6 g/dL — ABNORMAL LOW (ref 13.0–17.0)
MCH: 25.3 pg — ABNORMAL LOW (ref 26.0–34.0)
MCHC: 30.2 g/dL (ref 30.0–36.0)
MCV: 83.7 fL (ref 80.0–100.0)
Platelets: 327 10*3/uL (ref 150–400)
RBC: 3.8 MIL/uL — ABNORMAL LOW (ref 4.22–5.81)
RDW: 18.5 % — ABNORMAL HIGH (ref 11.5–15.5)
WBC: 7.3 10*3/uL (ref 4.0–10.5)
nRBC: 0 % (ref 0.0–0.2)

## 2019-09-16 LAB — SARS CORONAVIRUS 2 BY RT PCR (HOSPITAL ORDER, PERFORMED IN ~~LOC~~ HOSPITAL LAB): SARS Coronavirus 2: NEGATIVE

## 2019-09-16 MED ORDER — ACETAMINOPHEN 650 MG RE SUPP: 650.0000 mg | Freq: Four times a day (QID) | RECTAL | Status: DC | PRN

## 2019-09-16 MED ORDER — ASCORBIC ACID 500 MG PO TABS
500.0000 mg | ORAL_TABLET | Freq: Every day | ORAL | Status: DC
  Administered 2019-09-17: 500 mg via ORAL
  Filled 2019-09-16 (×2): qty 1

## 2019-09-16 MED ORDER — SODIUM CHLORIDE 0.9 % IV SOLN: INTRAVENOUS | Status: AC

## 2019-09-16 MED ORDER — MIDODRINE HCL 5 MG PO TABS
5.0000 mg | ORAL_TABLET | Freq: Three times a day (TID) | ORAL | Status: DC
  Administered 2019-09-16 – 2019-09-17 (×4): 5 mg via ORAL
  Filled 2019-09-16 (×6): qty 1

## 2019-09-16 MED ORDER — HEPARIN SODIUM (PORCINE) 5000 UNIT/ML IJ SOLN
5000.0000 [IU] | Freq: Three times a day (TID) | INTRAMUSCULAR | Status: DC
  Administered 2019-09-16 – 2019-09-17 (×3): 5000 [IU] via SUBCUTANEOUS
  Filled 2019-09-16 (×3): qty 1

## 2019-09-16 MED ORDER — HYDROMORPHONE HCL 2 MG PO TABS
2.0000 mg | ORAL_TABLET | Freq: Three times a day (TID) | ORAL | Status: DC | PRN
  Administered 2019-09-16 – 2019-09-17 (×4): 2 mg via ORAL
  Filled 2019-09-16 (×4): qty 1

## 2019-09-16 MED ORDER — IOHEXOL 300 MG/ML  SOLN
50.0000 mL | Freq: Once | INTRAMUSCULAR | Status: AC | PRN
  Administered 2019-09-16: 10 mL

## 2019-09-16 MED ORDER — VANCOMYCIN HCL IN DEXTROSE 1-5 GM/200ML-% IV SOLN
1000.0000 mg | Freq: Once | INTRAVENOUS | Status: AC
  Administered 2019-09-16: 1000 mg via INTRAVENOUS
  Filled 2019-09-16: qty 200

## 2019-09-16 MED ORDER — FENTANYL CITRATE (PF) 100 MCG/2ML IJ SOLN
INTRAMUSCULAR | Status: AC | PRN
  Administered 2019-09-16: 50 ug via INTRAVENOUS

## 2019-09-16 MED ORDER — FLUOXETINE HCL 20 MG PO CAPS
40.0000 mg | ORAL_CAPSULE | Freq: Two times a day (BID) | ORAL | Status: DC
  Administered 2019-09-16 – 2019-09-17 (×2): 40 mg via ORAL
  Filled 2019-09-16 (×4): qty 2

## 2019-09-16 MED ORDER — ONDANSETRON 4 MG PO TBDP: 4.0000 mg | ORAL_TABLET | Freq: Three times a day (TID) | ORAL | Status: DC | PRN

## 2019-09-16 MED ORDER — IOHEXOL 300 MG/ML  SOLN
80.0000 mL | Freq: Once | INTRAMUSCULAR | Status: AC | PRN
  Administered 2019-09-16: 80 mL via INTRAVENOUS

## 2019-09-16 MED ORDER — HEPARIN SODIUM (PORCINE) 5000 UNIT/ML IJ SOLN
5000.0000 [IU] | Freq: Three times a day (TID) | INTRAMUSCULAR | Status: DC
  Administered 2019-09-16: 5000 [IU] via SUBCUTANEOUS
  Filled 2019-09-16: qty 1

## 2019-09-16 MED ORDER — FLUDROCORTISONE ACETATE 0.1 MG PO TABS
0.1000 mg | ORAL_TABLET | Freq: Two times a day (BID) | ORAL | Status: DC
  Administered 2019-09-16 – 2019-09-17 (×2): 0.1 mg via ORAL
  Filled 2019-09-16 (×6): qty 1

## 2019-09-16 MED ORDER — TRAZODONE HCL 50 MG PO TABS
50.0000 mg | ORAL_TABLET | Freq: Every evening | ORAL | Status: DC | PRN
  Filled 2019-09-16: qty 1

## 2019-09-16 MED ORDER — FENTANYL CITRATE (PF) 100 MCG/2ML IJ SOLN
INTRAMUSCULAR | Status: AC
  Filled 2019-09-16: qty 2

## 2019-09-16 MED ORDER — CLONAZEPAM 0.5 MG PO TABS
0.5000 mg | ORAL_TABLET | Freq: Two times a day (BID) | ORAL | Status: DC | PRN
  Administered 2019-09-16 – 2019-09-17 (×3): 0.5 mg via ORAL
  Filled 2019-09-16 (×3): qty 1

## 2019-09-16 MED ORDER — LIDOCAINE VISCOUS HCL 2 % MT SOLN
OROMUCOSAL | Status: AC
  Filled 2019-09-16: qty 15

## 2019-09-16 MED ORDER — GABAPENTIN 300 MG PO CAPS
300.0000 mg | ORAL_CAPSULE | Freq: Three times a day (TID) | ORAL | Status: DC
  Administered 2019-09-16 – 2019-09-17 (×3): 300 mg via ORAL
  Filled 2019-09-16 (×2): qty 1
  Filled 2019-09-16: qty 3
  Filled 2019-09-16 (×2): qty 1

## 2019-09-16 MED ORDER — VITAMIN D (ERGOCALCIFEROL) 1.25 MG (50000 UNIT) PO CAPS: 50000.0000 [IU] | ORAL_CAPSULE | ORAL | Status: DC

## 2019-09-16 MED ORDER — SODIUM CHLORIDE 0.9% FLUSH
5.0000 mL | Freq: Three times a day (TID) | INTRAVENOUS | Status: DC
  Administered 2019-09-16 – 2019-09-17 (×2): 5 mL

## 2019-09-16 MED ORDER — ACETAMINOPHEN 325 MG PO TABS: 650.0000 mg | ORAL_TABLET | Freq: Four times a day (QID) | ORAL | Status: DC | PRN

## 2019-09-16 MED ORDER — LIDOCAINE HCL (PF) 1 % IJ SOLN
INTRAMUSCULAR | Status: AC | PRN
  Administered 2019-09-16: 10 mL

## 2019-09-16 MED ORDER — LIDOCAINE HCL 1 % IJ SOLN
INTRAMUSCULAR | Status: AC
  Filled 2019-09-16: qty 20

## 2019-09-16 NOTE — ED Notes (Signed)
Assisted pt with emptying colostomy bag

## 2019-09-16 NOTE — Progress Notes (Signed)
ANTICOAGULATION CONSULT NOTE - Initial Consult  Pharmacy Consult for Resuming Anticoagulants/Antiplatelets Post-IR Procedure Indication: VTE prophylaxis  Allergies  Allergen Reactions  . Penicillins Hives    Has patient had a PCN reaction causing immediate rash, facial/tongue/throat swelling, SOB or lightheadedness with hypotension: Yes Has patient had a PCN reaction causing severe rash involving mucus membranes or skin necrosis: Yes Has patient had a PCN reaction that required hospitalization Yes Has patient had a PCN reaction occurring within the last 10 years: Yes If all of the above answers are "NO", then may proceed with Cephalosporin use.      Patient Measurements: Height: 5\' 9"  (175.3 cm) Weight: 67.1 kg (148 lb) IBW/kg (Calculated) : 70.7  Vital Signs: Temp: 98.4 F (36.9 C) (06/09 1402) Temp Source: Oral (06/09 1402) BP: 123/80 (06/09 1556) Pulse Rate: 61 (06/09 1556)  Labs: Recent Labs    09/15/19 2122 09/16/19 0619  HGB 9.8* 9.6*  HCT 32.3* 31.8*  PLT 370 327  CREATININE 1.79* 1.98*    Estimated Creatinine Clearance: 51.8 mL/min (A) (by C-G formula based on SCr of 1.98 mg/dL (H)).   Medical History: Past Medical History:  Diagnosis Date  . Allergy   . Anxiety   . Asthma   . Dvt femoral (deep venous thrombosis) (HCC)   . Seizures [facility])     Assessment: 29 yr old male, S/P replacement of RUQ drain under fluoro to control fistula this afternoon by IR (low bleeding risk procedure, per consult). Pharmacy is consulted to restart anticoagulants/antiplatelets at the appropriate time post IR procedure, per [facility] protocol.  Prior to IR procedure, pt was receiving heparin 5000 units SQ Q 8 hrs (last dose given at 0631 this AM). H/H, platelets stable. Per RN, no bleeding issues post procedure.  Goal of Therapy:  Prevention of VTE Monitor platelets by anticoagulation protocol: Yes   Plan:  Restart heparin 5000 units SQ Q 8 hrs at 2200 this  evening Monitor CBC Monitor for signs/symptoms of bleeding  [facility], PharmD, BCPS, [facility] Clinical Pharmacist 09/16/2019,4:55 PM
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
## 2019-09-16 NOTE — Progress Notes (Signed)
Patient arrived to room 6N27 from ED via stretcher. Patient is alert and oriented times 4. VSS. NAD. Belongings and call bell within reach. Bed is in the lowest and locked position with bed rails up times 2. Patient oriented to unit and call bell. Patient updated as to plan of care and all questions addressed at this time. Patient to be transported to IR for drain placement.

## 2019-09-16 NOTE — Procedures (Signed)
  Procedure: Replacement of RUQ drain under fluoro to control fistula  73F EBL:   minimal Complications:  none immediate  See full dictation in [employer].  Thora Lance MD Main # [phone] Pager  [phone]
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
## 2019-09-16 NOTE — Consult Note (Signed)
  Known to WOC team from recent hospitalizations.   WOC Nurse ostomy consult note Stoma type/location: LLQ colostomy Stomal assessment/size: 1 "  Peristomal assessment: pouch intact Treatment options for stomal/peristomal skin: barrier ring Output liquid brown stool Ostomy pouching: 1pc. Convex pouch with barrier ring LAWSON # [id number] pouch and barrier ring # [id number] Education provided: none needed Enrolled patient in [employer] DC program: Yes previously Will not follow at this time.  Please re-consult if needed.                 WOC Nurse Consult Note: Reason for Consult:RLQ fistula with dislodgement of drain. Presents to ED to replace drain.  REfer to surgical service/interventional radiology for decision on management of this.  Wound type:fistula Pressure Injury POA: NA Measurement:0.3 cm opening in scar on right posterior flank.  Wound bed:not visible Drainage (amount, consistency, odor) moderate feculent effluent Periwound:creasing/scarring Dressing procedure/placement/frequency: Drain to be replaced per IR or surgery team.  Until then, dry dressing to open area.  Change QID and PRN soilage.  Will not follow at this time.  Please re-consult if needed.  Maple Hudson MSN, RN, FNP-BC [facility]ON Wound, Ostomy, Continence Nurse Pager [phone]
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
## 2019-09-17 DIAGNOSIS — D509 Iron deficiency anemia, unspecified: Secondary | ICD-10-CM

## 2019-09-17 LAB — CBC WITH DIFFERENTIAL/PLATELET
Abs Immature Granulocytes: 0.02 10*3/uL (ref 0.00–0.07)
Basophils Absolute: 0 10*3/uL (ref 0.0–0.1)
Basophils Relative: 0 %
Eosinophils Absolute: 0.3 10*3/uL (ref 0.0–0.5)
Eosinophils Relative: 5 %
HCT: 29.4 % — ABNORMAL LOW (ref 39.0–52.0)
Hemoglobin: 9.1 g/dL — ABNORMAL LOW (ref 13.0–17.0)
Immature Granulocytes: 0 %
Lymphocytes Relative: 27 %
Lymphs Abs: 1.4 10*3/uL (ref 0.7–4.0)
MCH: 25.9 pg — ABNORMAL LOW (ref 26.0–34.0)
MCHC: 31 g/dL (ref 30.0–36.0)
MCV: 83.5 fL (ref 80.0–100.0)
Monocytes Absolute: 0.5 10*3/uL (ref 0.1–1.0)
Monocytes Relative: 9 %
Neutro Abs: 3.1 10*3/uL (ref 1.7–7.7)
Neutrophils Relative %: 59 %
Platelets: 302 10*3/uL (ref 150–400)
RBC: 3.52 MIL/uL — ABNORMAL LOW (ref 4.22–5.81)
RDW: 18.4 % — ABNORMAL HIGH (ref 11.5–15.5)
WBC: 5.3 10*3/uL (ref 4.0–10.5)
nRBC: 0 % (ref 0.0–0.2)

## 2019-09-17 LAB — BASIC METABOLIC PANEL
Anion gap: 10 (ref 5–15)
BUN: 14 mg/dL (ref 6–20)
CO2: 19 mmol/L — ABNORMAL LOW (ref 22–32)
Calcium: 8.2 mg/dL — ABNORMAL LOW (ref 8.9–10.3)
Chloride: 107 mmol/L (ref 98–111)
Creatinine, Ser: 1.91 mg/dL — ABNORMAL HIGH (ref 0.61–1.24)
GFR calc Af Amer: 53 mL/min — ABNORMAL LOW (ref >60)
GFR calc non Af Amer: 46 mL/min — ABNORMAL LOW (ref >60)
Glucose, Bld: 94 mg/dL (ref 70–99)
Potassium: 3.8 mmol/L (ref 3.5–5.1)
Sodium: 136 mmol/L (ref 135–145)

## 2019-09-17 LAB — IRON AND TIBC
Iron: 21 ug/dL — ABNORMAL LOW (ref 45–182)
Saturation Ratios: 7 % — ABNORMAL LOW (ref 17.9–39.5)
TIBC: 287 ug/dL (ref 250–450)
UIBC: 266 ug/dL

## 2019-09-17 LAB — FOLATE: Folate: 19 ng/mL (ref >5.9)

## 2019-09-17 LAB — VITAMIN B12: Vitamin B-12: 515 pg/mL (ref 180–914)

## 2019-09-17 LAB — FERRITIN: Ferritin: 72 ng/mL (ref 24–336)

## 2019-09-17 MED ORDER — SODIUM CHLORIDE 0.9 % IV SOLN
510.0000 mg | Freq: Once | INTRAVENOUS | Status: AC
  Administered 2019-09-17: 510 mg via INTRAVENOUS
  Filled 2019-09-17: qty 17

## 2019-09-17 MED ORDER — SODIUM BICARBONATE 650 MG PO TABS: 650.0000 mg | ORAL_TABLET | Freq: Two times a day (BID) | ORAL | 0 refills | Status: AC

## 2019-09-17 MED ORDER — FERROUS SULFATE 325 (65 FE) MG PO TABS: 325.0000 mg | ORAL_TABLET | Freq: Every day | ORAL | 0 refills | Status: AC
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
## 2019-09-17 NOTE — Discharge Summary (Signed)
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 09/17/2019, 2:44 PM

## 2019-09-21 ENCOUNTER — Other Ambulatory Visit (HOSPITAL_COMMUNITY): Payer: Self-pay | Admitting: Surgery

## 2019-09-21 DIAGNOSIS — T8143XA Infection following a procedure, organ and space surgical site, initial encounter: Secondary | ICD-10-CM

## 2019-09-23 ENCOUNTER — Ambulatory Visit (INDEPENDENT_AMBULATORY_CARE_PROVIDER_SITE_OTHER): Payer: Self-pay | Admitting: Plastic Surgery

## 2019-09-23 ENCOUNTER — Encounter: Payer: Self-pay | Admitting: Plastic Surgery

## 2019-09-23 ENCOUNTER — Other Ambulatory Visit: Payer: Self-pay

## 2019-09-23 ENCOUNTER — Telehealth: Payer: Self-pay

## 2019-09-23 DIAGNOSIS — S31109A Unspecified open wound of abdominal wall, unspecified quadrant without penetration into peritoneal cavity, initial encounter: Secondary | ICD-10-CM | POA: Insufficient documentation

## 2019-09-23 NOTE — Telephone Encounter (Signed)
Faxed [facility] order for; 4x4 gauze-daily, adaptic 4x4-daily, mepilex border-daily
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
## 2019-09-25 ENCOUNTER — Telehealth: Payer: Self-pay

## 2019-09-25 NOTE — Telephone Encounter (Signed)
Received fax from [facility] that they could not get in touch with patient. Called brother and advised him to call [facility] in order to receive medical supplies and provided phone number to call.

## 2019-10-05 ENCOUNTER — Ambulatory Visit (HOSPITAL_COMMUNITY): Admission: RE | Admit: 2019-10-05 | Payer: Medicaid Other | Source: Ambulatory Visit

## 2019-10-05 ENCOUNTER — Ambulatory Visit (HOSPITAL_COMMUNITY): Admission: RE | Admit: 2019-10-05 | Payer: Self-pay | Source: Ambulatory Visit

## 2019-10-06 ENCOUNTER — Ambulatory Visit: Payer: Medicaid Other | Admitting: Plastic Surgery

## 2019-10-13 ENCOUNTER — Ambulatory Visit: Payer: Medicaid Other | Admitting: Plastic Surgery

## 2019-10-17 ENCOUNTER — Inpatient Hospital Stay (HOSPITAL_COMMUNITY)
Admission: EM | Admit: 2019-10-17 | Discharge: 2019-11-13 | DRG: 393 | Disposition: A | Discharge status: Home / Self Care | Payer: Self-pay | Attending: Family Medicine | Admitting: Family Medicine

## 2019-10-17 ENCOUNTER — Emergency Department (HOSPITAL_COMMUNITY): Payer: Self-pay

## 2019-10-17 ENCOUNTER — Encounter (HOSPITAL_COMMUNITY): Payer: Self-pay

## 2019-10-17 ENCOUNTER — Other Ambulatory Visit: Payer: Self-pay

## 2019-10-17 DIAGNOSIS — Z9049 Acquired absence of other specified parts of digestive tract: Secondary | ICD-10-CM

## 2019-10-17 DIAGNOSIS — E162 Hypoglycemia, unspecified: Secondary | ICD-10-CM | POA: Diagnosis not present

## 2019-10-17 DIAGNOSIS — Z932 Ileostomy status: Secondary | ICD-10-CM

## 2019-10-17 DIAGNOSIS — E871 Hypo-osmolality and hyponatremia: Secondary | ICD-10-CM | POA: Diagnosis not present

## 2019-10-17 DIAGNOSIS — E860 Dehydration: Secondary | ICD-10-CM | POA: Diagnosis present

## 2019-10-17 DIAGNOSIS — Z79899 Other long term (current) drug therapy: Secondary | ICD-10-CM

## 2019-10-17 DIAGNOSIS — Z681 Body mass index (BMI) 19 or less, adult: Secondary | ICD-10-CM

## 2019-10-17 DIAGNOSIS — Z20822 Contact with and (suspected) exposure to covid-19: Secondary | ICD-10-CM | POA: Diagnosis present

## 2019-10-17 DIAGNOSIS — N179 Acute kidney failure, unspecified: Secondary | ICD-10-CM | POA: Diagnosis present

## 2019-10-17 DIAGNOSIS — D6489 Other specified anemias: Secondary | ICD-10-CM | POA: Diagnosis present

## 2019-10-17 DIAGNOSIS — G8929 Other chronic pain: Secondary | ICD-10-CM | POA: Diagnosis present

## 2019-10-17 DIAGNOSIS — F418 Other specified anxiety disorders: Secondary | ICD-10-CM | POA: Diagnosis present

## 2019-10-17 DIAGNOSIS — L02212 Cutaneous abscess of back [any part, except buttock]: Secondary | ICD-10-CM

## 2019-10-17 DIAGNOSIS — E872 Acidosis, unspecified: Secondary | ICD-10-CM | POA: Diagnosis present

## 2019-10-17 DIAGNOSIS — Z933 Colostomy status: Secondary | ICD-10-CM

## 2019-10-17 DIAGNOSIS — E43 Unspecified severe protein-calorie malnutrition: Secondary | ICD-10-CM

## 2019-10-17 DIAGNOSIS — E876 Hypokalemia: Secondary | ICD-10-CM | POA: Diagnosis present

## 2019-10-17 DIAGNOSIS — Z515 Encounter for palliative care: Secondary | ICD-10-CM

## 2019-10-17 DIAGNOSIS — Z87891 Personal history of nicotine dependence: Secondary | ICD-10-CM

## 2019-10-17 DIAGNOSIS — F1510 Other stimulant abuse, uncomplicated: Secondary | ICD-10-CM | POA: Diagnosis present

## 2019-10-17 DIAGNOSIS — Z86718 Personal history of other venous thrombosis and embolism: Secondary | ICD-10-CM

## 2019-10-17 DIAGNOSIS — M109 Gout, unspecified: Secondary | ICD-10-CM | POA: Diagnosis present

## 2019-10-17 DIAGNOSIS — F209 Schizophrenia, unspecified: Secondary | ICD-10-CM | POA: Diagnosis present

## 2019-10-17 DIAGNOSIS — Z905 Acquired absence of kidney: Secondary | ICD-10-CM

## 2019-10-17 DIAGNOSIS — E46 Unspecified protein-calorie malnutrition: Secondary | ICD-10-CM

## 2019-10-17 DIAGNOSIS — N182 Chronic kidney disease, stage 2 (mild): Secondary | ICD-10-CM | POA: Diagnosis present

## 2019-10-17 DIAGNOSIS — K632 Fistula of intestine: Principal | ICD-10-CM

## 2019-10-17 LAB — LACTIC ACID, PLASMA
Lactic Acid, Venous: 3.2 mmol/L (ref 0.5–1.9)
Lactic Acid, Venous: 1.7 mmol/L (ref 0.5–1.9)

## 2019-10-17 LAB — CBC WITH DIFFERENTIAL/PLATELET
Abs Immature Granulocytes: 0.02 10*3/uL (ref 0.00–0.07)
Basophils Absolute: 0 10*3/uL (ref 0.0–0.1)
Basophils Relative: 1 %
Eosinophils Absolute: 0.3 10*3/uL (ref 0.0–0.5)
Eosinophils Relative: 6 %
HCT: 45.3 % (ref 39.0–52.0)
Hemoglobin: 14.3 g/dL (ref 13.0–17.0)
Immature Granulocytes: 0 %
Lymphocytes Relative: 20 %
Lymphs Abs: 1 10*3/uL (ref 0.7–4.0)
MCH: 26.8 pg (ref 26.0–34.0)
MCHC: 31.6 g/dL (ref 30.0–36.0)
MCV: 84.8 fL (ref 80.0–100.0)
Monocytes Absolute: 0.5 10*3/uL (ref 0.1–1.0)
Monocytes Relative: 10 %
Neutro Abs: 3.2 10*3/uL (ref 1.7–7.7)
Neutrophils Relative %: 63 %
Platelets: 374 10*3/uL (ref 150–400)
RBC: 5.34 MIL/uL (ref 4.22–5.81)
RDW: 19.9 % — ABNORMAL HIGH (ref 11.5–15.5)
WBC: 5.1 10*3/uL (ref 4.0–10.5)
nRBC: 0 % (ref 0.0–0.2)

## 2019-10-17 LAB — COMPREHENSIVE METABOLIC PANEL
ALT: 26 U/L (ref 0–44)
AST: 25 U/L (ref 15–41)
Albumin: 3.2 g/dL — ABNORMAL LOW (ref 3.5–5.0)
Alkaline Phosphatase: 321 U/L — ABNORMAL HIGH (ref 38–126)
Anion gap: 13 (ref 5–15)
BUN: 24 mg/dL — ABNORMAL HIGH (ref 6–20)
CO2: 16 mmol/L — ABNORMAL LOW (ref 22–32)
Calcium: 9.2 mg/dL (ref 8.9–10.3)
Chloride: 104 mmol/L (ref 98–111)
Creatinine, Ser: 1.64 mg/dL — ABNORMAL HIGH (ref 0.61–1.24)
GFR calc Af Amer: >60 mL/min (ref >60)
GFR calc non Af Amer: 55 mL/min — ABNORMAL LOW (ref >60)
Glucose, Bld: 122 mg/dL — ABNORMAL HIGH (ref 70–99)
Potassium: 3.8 mmol/L (ref 3.5–5.1)
Sodium: 133 mmol/L — ABNORMAL LOW (ref 135–145)
Total Bilirubin: 0.8 mg/dL (ref 0.3–1.2)
Total Protein: 9.1 g/dL — ABNORMAL HIGH (ref 6.5–8.1)

## 2019-10-17 LAB — PROTIME-INR
INR: 1.1 (ref 0.8–1.2)
Prothrombin Time: 13.6 seconds (ref 11.4–15.2)

## 2019-10-17 LAB — APTT: aPTT: 22 seconds — ABNORMAL LOW (ref 24–36)

## 2019-10-17 MED ORDER — FLUOXETINE HCL 20 MG PO CAPS
40.0000 mg | ORAL_CAPSULE | Freq: Two times a day (BID) | ORAL | Status: DC
  Administered 2019-10-17 – 2019-11-13 (×54): 40 mg via ORAL
  Filled 2019-10-17 (×54): qty 2

## 2019-10-17 MED ORDER — ACETAMINOPHEN 650 MG RE SUPP: 650.0000 mg | Freq: Four times a day (QID) | RECTAL | Status: DC | PRN

## 2019-10-17 MED ORDER — ONDANSETRON HCL 4 MG/2ML IJ SOLN
4.0000 mg | Freq: Four times a day (QID) | INTRAMUSCULAR | Status: DC | PRN
  Administered 2019-10-20: 4 mg via INTRAVENOUS
  Filled 2019-10-17: qty 2

## 2019-10-17 MED ORDER — SODIUM CHLORIDE 0.9 % IV SOLN: INTRAVENOUS | Status: AC

## 2019-10-17 MED ORDER — HYDROMORPHONE HCL 1 MG/ML IJ SOLN
0.5000 mg | INTRAMUSCULAR | Status: DC | PRN
  Administered 2019-10-18 – 2019-11-01 (×52): 0.5 mg via INTRAVENOUS
  Filled 2019-10-17 (×55): qty 1

## 2019-10-17 MED ORDER — SODIUM CHLORIDE 0.9% FLUSH
3.0000 mL | Freq: Once | INTRAVENOUS | Status: AC
  Administered 2019-10-23: 3 mL via INTRAVENOUS

## 2019-10-17 MED ORDER — IOHEXOL 300 MG/ML  SOLN
100.0000 mL | Freq: Once | INTRAMUSCULAR | Status: AC | PRN
  Administered 2019-10-17: 100 mL via INTRAVENOUS

## 2019-10-17 MED ORDER — SODIUM CHLORIDE 0.9 % IV BOLUS
1000.0000 mL | Freq: Once | INTRAVENOUS | Status: AC
  Administered 2019-10-17: 1000 mL via INTRAVENOUS

## 2019-10-17 MED ORDER — ONDANSETRON HCL 4 MG PO TABS: 4.0000 mg | ORAL_TABLET | Freq: Four times a day (QID) | ORAL | Status: DC | PRN

## 2019-10-17 MED ORDER — LACTATED RINGERS IV BOLUS
1000.0000 mL | Freq: Once | INTRAVENOUS | Status: AC
  Administered 2019-10-17: 1000 mL via INTRAVENOUS

## 2019-10-17 MED ORDER — ACETAMINOPHEN 325 MG PO TABS
650.0000 mg | ORAL_TABLET | Freq: Four times a day (QID) | ORAL | Status: DC | PRN
  Administered 2019-10-23 – 2019-10-27 (×2): 650 mg via ORAL
  Filled 2019-10-17 (×3): qty 2

## 2019-10-17 MED ORDER — CLONAZEPAM 0.5 MG PO TABS
0.5000 mg | ORAL_TABLET | Freq: Two times a day (BID) | ORAL | Status: DC | PRN
  Administered 2019-10-18 – 2019-11-13 (×28): 0.5 mg via ORAL
  Filled 2019-10-17 (×29): qty 1

## 2019-10-17 MED ORDER — GABAPENTIN 300 MG PO CAPS
300.0000 mg | ORAL_CAPSULE | Freq: Three times a day (TID) | ORAL | Status: DC
  Administered 2019-10-17 – 2019-11-13 (×79): 300 mg via ORAL
  Filled 2019-10-17 (×80): qty 1
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
## 2019-10-17 NOTE — ED Notes (Signed)
hes eating pizza

## 2019-10-17 NOTE — Progress Notes (Signed)
Notified provider of need to order antibiotics. Thank you.

## 2019-10-17 NOTE — ED Notes (Signed)
Pt asking for colostomy supplies which we do not have

## 2019-10-18 ENCOUNTER — Encounter (HOSPITAL_COMMUNITY): Payer: Self-pay | Admitting: Internal Medicine

## 2019-10-18 ENCOUNTER — Other Ambulatory Visit: Payer: Self-pay

## 2019-10-18 DIAGNOSIS — E876 Hypokalemia: Secondary | ICD-10-CM | POA: Diagnosis present

## 2019-10-18 DIAGNOSIS — N182 Chronic kidney disease, stage 2 (mild): Secondary | ICD-10-CM | POA: Diagnosis present

## 2019-10-18 DIAGNOSIS — N179 Acute kidney failure, unspecified: Secondary | ICD-10-CM | POA: Diagnosis present

## 2019-10-18 DIAGNOSIS — Z932 Ileostomy status: Secondary | ICD-10-CM

## 2019-10-18 DIAGNOSIS — Z9049 Acquired absence of other specified parts of digestive tract: Secondary | ICD-10-CM

## 2019-10-18 DIAGNOSIS — E872 Acidosis, unspecified: Secondary | ICD-10-CM | POA: Diagnosis present

## 2019-10-18 DIAGNOSIS — Z86718 Personal history of other venous thrombosis and embolism: Secondary | ICD-10-CM

## 2019-10-18 DIAGNOSIS — F418 Other specified anxiety disorders: Secondary | ICD-10-CM

## 2019-10-18 DIAGNOSIS — G8929 Other chronic pain: Secondary | ICD-10-CM

## 2019-10-18 LAB — BASIC METABOLIC PANEL
Anion gap: 8 (ref 5–15)
BUN: 16 mg/dL (ref 6–20)
CO2: 19 mmol/L — ABNORMAL LOW (ref 22–32)
Calcium: 8.7 mg/dL — ABNORMAL LOW (ref 8.9–10.3)
Chloride: 110 mmol/L (ref 98–111)
Creatinine, Ser: 1.25 mg/dL — ABNORMAL HIGH (ref 0.61–1.24)
GFR calc Af Amer: >60 mL/min (ref >60)
GFR calc non Af Amer: >60 mL/min (ref >60)
Glucose, Bld: 110 mg/dL — ABNORMAL HIGH (ref 70–99)
Potassium: 3.4 mmol/L — ABNORMAL LOW (ref 3.5–5.1)
Sodium: 137 mmol/L (ref 135–145)

## 2019-10-18 LAB — CBC
HCT: 40.2 % (ref 39.0–52.0)
Hemoglobin: 12.9 g/dL — ABNORMAL LOW (ref 13.0–17.0)
MCH: 27.2 pg (ref 26.0–34.0)
MCHC: 32.1 g/dL (ref 30.0–36.0)
MCV: 84.8 fL (ref 80.0–100.0)
Platelets: 331 10*3/uL (ref 150–400)
RBC: 4.74 MIL/uL (ref 4.22–5.81)
RDW: 19.6 % — ABNORMAL HIGH (ref 11.5–15.5)
WBC: 5.2 10*3/uL (ref 4.0–10.5)
nRBC: 0 % (ref 0.0–0.2)

## 2019-10-18 LAB — URINALYSIS, ROUTINE W REFLEX MICROSCOPIC
Bilirubin Urine: NEGATIVE
Glucose, UA: NEGATIVE mg/dL
Hgb urine dipstick: NEGATIVE
Ketones, ur: NEGATIVE mg/dL
Leukocytes,Ua: NEGATIVE
Nitrite: NEGATIVE
Protein, ur: NEGATIVE mg/dL
Specific Gravity, Urine: 1.01 (ref 1.005–1.030)
pH: 5 (ref 5.0–8.0)

## 2019-10-18 LAB — RAPID URINE DRUG SCREEN, HOSP PERFORMED
Amphetamines: POSITIVE — AB
Barbiturates: NOT DETECTED
Benzodiazepines: NOT DETECTED
Cocaine: NOT DETECTED
Opiates: NOT DETECTED
Tetrahydrocannabinol: NOT DETECTED

## 2019-10-18 LAB — PREALBUMIN: Prealbumin: 5.7 mg/dL — ABNORMAL LOW (ref 18–38)

## 2019-10-18 LAB — SARS CORONAVIRUS 2 BY RT PCR (HOSPITAL ORDER, PERFORMED IN ~~LOC~~ HOSPITAL LAB): SARS Coronavirus 2: NEGATIVE

## 2019-10-18 MED ORDER — POTASSIUM CHLORIDE 10 MEQ/100ML IV SOLN
10.0000 meq | INTRAVENOUS | Status: AC
  Administered 2019-10-18 (×4): 10 meq via INTRAVENOUS
  Filled 2019-10-18 (×4): qty 100

## 2019-10-18 NOTE — Plan of Care (Signed)
  Problem: Education: Goal: Knowledge of General Education information will improve Description Including pain rating scale, medication(s)/side effects and non-pharmacologic comfort measures Outcome: Progressing   

## 2019-10-18 NOTE — Progress Notes (Signed)
Patient has order for telemetry. No boxes available at this time. Secretary has called multiple units and no boxes available, will continue to look for telemetry box.
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
## 2019-10-18 NOTE — ED Notes (Signed)
Report attempted x 1

## 2019-10-19 LAB — URINE CULTURE: Culture: NO GROWTH

## 2019-10-19 NOTE — Progress Notes (Signed)
Sternal dressing changed per order.  Patient tolerated well.  Will continue to monitor.  Renda Rolls RN 10/19/19 @ [phone]

## 2019-10-19 NOTE — Consult Note (Signed)
WOC Nurse Consult Note: The one piece flexible fecal pouch with barrier ring placed to the left flank area is holding, no undermining.  Additional supplies are in the top drawer of the nightstand.  If this approach continues to work to collect the effluent of the [facility] fistula, we will continue with this approach.  If needed, we can switch to a small Eakin pouch.   Helmut Muster, RN, MSN, CWOCN, CNS-BC, pager [phone]

## 2019-10-19 NOTE — Consult Note (Signed)
WOC Nurse Consult Note: Patient receiving care in [facility] [phone]. Reason for Consult: enterocutaneous flank fistula Wound type: EC fistula to right flank draining what is consistent with small bowel effluent. Pressure Injury POA: Yes/No/NA Measurement: pinhole size opening in a crease Wound bed: Drainage (amount, consistency, odor)  Periwound: Dressing procedure/placement/frequency: I placed a barrier ring in the creases then a flat one piece ostomy pouch. I expect I will need to change to a small E[place] pouch. I will check back this afternoon to make a determination on this.  I have placed orders in the record for a high output, 2 pc 2 3/4 inch pouching system that can be placed to a bedside drainage bag.  I have also ordered twice application of saline moistened gauze dressings to the healing abdominal surgical wounds.  Helmut Muster, RN, MSN, CWOCN, CNS-BC, pager [phone]

## 2019-10-19 NOTE — Plan of Care (Signed)
  Problem: Education: Goal: Knowledge of General Education information will improve Description Including pain rating scale, medication(s)/side effects and non-pharmacologic comfort measures Outcome: Progressing   
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
## 2019-10-20 ENCOUNTER — Inpatient Hospital Stay (HOSPITAL_COMMUNITY): Payer: Self-pay

## 2019-10-20 ENCOUNTER — Inpatient Hospital Stay: Payer: Self-pay

## 2019-10-20 DIAGNOSIS — E46 Unspecified protein-calorie malnutrition: Secondary | ICD-10-CM

## 2019-10-20 DIAGNOSIS — E43 Unspecified severe protein-calorie malnutrition: Secondary | ICD-10-CM

## 2019-10-20 LAB — COMPREHENSIVE METABOLIC PANEL
ALT: 17 U/L (ref 0–44)
AST: 18 U/L (ref 15–41)
Albumin: 2.3 g/dL — ABNORMAL LOW (ref 3.5–5.0)
Alkaline Phosphatase: 210 U/L — ABNORMAL HIGH (ref 38–126)
Anion gap: 11 (ref 5–15)
BUN: 8 mg/dL (ref 6–20)
CO2: 17 mmol/L — ABNORMAL LOW (ref 22–32)
Calcium: 8.8 mg/dL — ABNORMAL LOW (ref 8.9–10.3)
Chloride: 107 mmol/L (ref 98–111)
Creatinine, Ser: 1.27 mg/dL — ABNORMAL HIGH (ref 0.61–1.24)
GFR calc Af Amer: >60 mL/min (ref >60)
GFR calc non Af Amer: >60 mL/min (ref >60)
Glucose, Bld: 64 mg/dL — ABNORMAL LOW (ref 70–99)
Potassium: 3.7 mmol/L (ref 3.5–5.1)
Sodium: 135 mmol/L (ref 135–145)
Total Bilirubin: 0.5 mg/dL (ref 0.3–1.2)
Total Protein: 6.8 g/dL (ref 6.5–8.1)

## 2019-10-20 LAB — CBC WITH DIFFERENTIAL/PLATELET
Abs Immature Granulocytes: 0.01 10*3/uL (ref 0.00–0.07)
Basophils Absolute: 0 10*3/uL (ref 0.0–0.1)
Basophils Relative: 0 %
Eosinophils Absolute: 0.2 10*3/uL (ref 0.0–0.5)
Eosinophils Relative: 4 %
HCT: 34.5 % — ABNORMAL LOW (ref 39.0–52.0)
Hemoglobin: 10.5 g/dL — ABNORMAL LOW (ref 13.0–17.0)
Immature Granulocytes: 0 %
Lymphocytes Relative: 20 %
Lymphs Abs: 0.8 10*3/uL (ref 0.7–4.0)
MCH: 26.1 pg (ref 26.0–34.0)
MCHC: 30.4 g/dL (ref 30.0–36.0)
MCV: 85.8 fL (ref 80.0–100.0)
Monocytes Absolute: 0.5 10*3/uL (ref 0.1–1.0)
Monocytes Relative: 12 %
Neutro Abs: 2.5 10*3/uL (ref 1.7–7.7)
Neutrophils Relative %: 64 %
Platelets: 273 10*3/uL (ref 150–400)
RBC: 4.02 MIL/uL — ABNORMAL LOW (ref 4.22–5.81)
RDW: 19 % — ABNORMAL HIGH (ref 11.5–15.5)
WBC: 4 10*3/uL (ref 4.0–10.5)
nRBC: 0 % (ref 0.0–0.2)

## 2019-10-20 LAB — MAGNESIUM: Magnesium: 1.7 mg/dL (ref 1.7–2.4)

## 2019-10-20 LAB — HEMOGLOBIN A1C
Hgb A1c MFr Bld: 4.8 % (ref 4.8–5.6)
Mean Plasma Glucose: 91.06 mg/dL

## 2019-10-20 LAB — GLUCOSE, CAPILLARY
Glucose-Capillary: 104 mg/dL — ABNORMAL HIGH (ref 70–99)
Glucose-Capillary: 98 mg/dL (ref 70–99)

## 2019-10-20 LAB — PHOSPHORUS: Phosphorus: 2.9 mg/dL (ref 2.5–4.6)

## 2019-10-20 MED ORDER — PROSOURCE PLUS PO LIQD
30.0000 mL | Freq: Two times a day (BID) | ORAL | Status: DC
  Administered 2019-10-21 – 2019-10-23 (×2): 30 mL via ORAL
  Filled 2019-10-20 (×6): qty 30

## 2019-10-20 MED ORDER — BOOST / RESOURCE BREEZE PO LIQD CUSTOM
1.0000 | Freq: Three times a day (TID) | ORAL | Status: DC
  Administered 2019-10-20 – 2019-10-21 (×3): 1 via ORAL

## 2019-10-20 MED ORDER — SODIUM CHLORIDE 0.9 % IV SOLN: INTRAVENOUS | Status: DC

## 2019-10-20 MED ORDER — DEXTROSE-NACL 5-0.9 % IV SOLN: INTRAVENOUS | Status: DC

## 2019-10-20 MED ORDER — IOHEXOL 300 MG/ML  SOLN
100.0000 mL | Freq: Once | INTRAMUSCULAR | Status: AC | PRN
  Administered 2019-10-20: 100 mL via INTRAVENOUS

## 2019-10-20 MED ORDER — STERILE WATER FOR INJECTION IV SOLN
INTRAVENOUS | Status: DC
  Filled 2019-10-20 (×2): qty 850

## 2019-10-20 MED ORDER — DEXTROSE IN LACTATED RINGERS 5 % IV SOLN: INTRAVENOUS | Status: AC

## 2019-10-20 MED ORDER — INSULIN ASPART 100 UNIT/ML ~~LOC~~ SOLN
0.0000 [IU] | Freq: Four times a day (QID) | SUBCUTANEOUS | Status: DC
  Administered 2019-10-22 (×2): 1 [IU] via SUBCUTANEOUS

## 2019-10-20 MED ORDER — MAGNESIUM SULFATE 2 GM/50ML IV SOLN
2.0000 g | Freq: Once | INTRAVENOUS | Status: AC
  Administered 2019-10-20: 2 g via INTRAVENOUS
  Filled 2019-10-20: qty 50

## 2019-10-20 MED ORDER — TRAVASOL 10 % IV SOLN
INTRAVENOUS | Status: DC
  Filled 2019-10-20: qty 499.2

## 2019-10-20 NOTE — Progress Notes (Signed)
Attempted right and left upper arm for PICC placement, unsuccessful. Attempted bilateral arms X2, unable to pass guide wire past axillary vein. Pt tolerated well, RN made aware. Will notify MD of recommendation of IR placement of PICC line. Stacie Glaze RN at bedside attempted as well without success.

## 2019-10-20 NOTE — Progress Notes (Signed)
Peripherally Inserted Central Catheter Placement  The IV Nurse has discussed with the patient and/or persons authorized to consent for the patient, the purpose of this procedure and the potential benefits and risks involved with this procedure.  The benefits include less needle sticks, lab draws from the catheter, and the patient may be discharged home with the catheter. Risks include, but not limited to, infection, bleeding, blood clot (thrombus formation), and puncture of an artery; nerve damage and irregular heartbeat and possibility to perform a PICC exchange if needed/ordered by physician.  Alternatives to this procedure were also discussed.  Bard Power PICC patient education guide, fact sheet on infection prevention and patient information card has been provided to patient /or left at bedside.    PICC Placement Documentation        Aralynn Brake, Ricarda Frame 10/20/2019, 5:30 PM

## 2019-10-20 NOTE — Progress Notes (Signed)
Strict I& O has been difficult to achieve due to pt's noncompliance. Pt asked for assistance to get up and use toilet and was asked to please use the urinal so that we could measure was assisted to the bathroom per his preference. I asked him to let me empty his colostomy when he finished urinating so that I could measure the output, pt then proceeded to urinate in the toilet and empty his colostomy into the toilet and all over the toilet and floor. He is alert and oriented and conscious of his actions. He has numerous times attempt to get food (broth, crackers, pudding) from staff even though he is NPO except for water and he will even state this when he asks for food. 10/20/2019 @ 0646 Manson Allan, RN
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
## 2019-10-21 ENCOUNTER — Inpatient Hospital Stay (HOSPITAL_COMMUNITY): Payer: Self-pay

## 2019-10-21 HISTORY — PX: IR SINUS/FIST TUBE CHK-NON GI: IMG673

## 2019-10-21 LAB — TRIGLYCERIDES: Triglycerides: 79 mg/dL (ref <150)

## 2019-10-21 LAB — CBC
HCT: 41 % (ref 39.0–52.0)
Hemoglobin: 12.8 g/dL — ABNORMAL LOW (ref 13.0–17.0)
MCH: 26.2 pg (ref 26.0–34.0)
MCHC: 31.2 g/dL (ref 30.0–36.0)
MCV: 84 fL (ref 80.0–100.0)
Platelets: 292 10*3/uL (ref 150–400)
RBC: 4.88 MIL/uL (ref 4.22–5.81)
RDW: 18.8 % — ABNORMAL HIGH (ref 11.5–15.5)
WBC: 5.3 10*3/uL (ref 4.0–10.5)
nRBC: 0 % (ref 0.0–0.2)

## 2019-10-21 LAB — BASIC METABOLIC PANEL
Anion gap: 10 (ref 5–15)
BUN: <5 mg/dL — ABNORMAL LOW (ref 6–20)
CO2: 26 mmol/L (ref 22–32)
Calcium: 8.6 mg/dL — ABNORMAL LOW (ref 8.9–10.3)
Chloride: 103 mmol/L (ref 98–111)
Creatinine, Ser: 1.05 mg/dL (ref 0.61–1.24)
GFR calc Af Amer: >60 mL/min (ref >60)
GFR calc non Af Amer: >60 mL/min (ref >60)
Glucose, Bld: 88 mg/dL (ref 70–99)
Potassium: 3.9 mmol/L (ref 3.5–5.1)
Sodium: 139 mmol/L (ref 135–145)

## 2019-10-21 LAB — GLUCOSE, CAPILLARY
Glucose-Capillary: 109 mg/dL — ABNORMAL HIGH (ref 70–99)
Glucose-Capillary: 112 mg/dL — ABNORMAL HIGH (ref 70–99)
Glucose-Capillary: 130 mg/dL — ABNORMAL HIGH (ref 70–99)
Glucose-Capillary: 137 mg/dL — ABNORMAL HIGH (ref 70–99)
Glucose-Capillary: 95 mg/dL (ref 70–99)

## 2019-10-21 LAB — MAGNESIUM: Magnesium: 2.1 mg/dL (ref 1.7–2.4)

## 2019-10-21 LAB — DIFFERENTIAL
Abs Immature Granulocytes: 0.02 10*3/uL (ref 0.00–0.07)
Basophils Absolute: 0 10*3/uL (ref 0.0–0.1)
Basophils Relative: 0 %
Eosinophils Absolute: 0.2 10*3/uL (ref 0.0–0.5)
Eosinophils Relative: 4 %
Immature Granulocytes: 0 %
Lymphocytes Relative: 18 %
Lymphs Abs: 1 10*3/uL (ref 0.7–4.0)
Monocytes Absolute: 0.7 10*3/uL (ref 0.1–1.0)
Monocytes Relative: 13 %
Neutro Abs: 3.4 10*3/uL (ref 1.7–7.7)
Neutrophils Relative %: 65 %

## 2019-10-21 LAB — PHOSPHORUS: Phosphorus: 1.8 mg/dL — ABNORMAL LOW (ref 2.5–4.6)

## 2019-10-21 MED ORDER — SODIUM CHLORIDE 0.9% FLUSH: 10.0000 mL | INTRAVENOUS | Status: DC | PRN

## 2019-10-21 MED ORDER — BOOST / RESOURCE BREEZE PO LIQD CUSTOM: 1.0000 | Freq: Three times a day (TID) | ORAL | Status: DC | PRN

## 2019-10-21 MED ORDER — POTASSIUM PHOSPHATES 15 MMOLE/5ML IV SOLN
30.0000 mmol | Freq: Once | INTRAVENOUS | Status: AC
  Administered 2019-10-21: 30 mmol via INTRAVENOUS
  Filled 2019-10-21: qty 10

## 2019-10-21 MED ORDER — CHLORHEXIDINE GLUCONATE CLOTH 2 % EX PADS
6.0000 | MEDICATED_PAD | Freq: Every day | CUTANEOUS | Status: DC
  Administered 2019-10-21 – 2019-11-13 (×21): 6 via TOPICAL

## 2019-10-21 MED ORDER — LIDOCAINE HCL 1 % IJ SOLN
INTRAMUSCULAR | Status: AC
  Filled 2019-10-21: qty 20

## 2019-10-21 MED ORDER — ENSURE ENLIVE PO LIQD
237.0000 mL | Freq: Three times a day (TID) | ORAL | Status: DC
  Administered 2019-10-21: 237 mL via ORAL

## 2019-10-21 MED ORDER — LIDOCAINE HCL (PF) 1 % IJ SOLN
INTRAMUSCULAR | Status: DC | PRN
  Administered 2019-10-21: 10 mL

## 2019-10-21 MED ORDER — PSYLLIUM 95 % PO PACK
1.0000 | PACK | Freq: Every day | ORAL | Status: DC
  Administered 2019-10-23 – 2019-11-01 (×8): 1 via ORAL
  Filled 2019-10-21 (×17): qty 1

## 2019-10-21 NOTE — Progress Notes (Signed)
Chart reviewed.  Discussed with attending physician.  Visited patient briefly.  His lights were dark and he wanted to sleep.  He hugged me and I said I would return to talk with him tomorrow.  Norvel Richards, PA-C Palliative Medicine Office:  [phone]
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
## 2019-10-21 NOTE — Consult Note (Signed)
WOC Nurse Consult Note: Patient receiving care in [phone]. Reason for Consult: Right flank fistula.  This was addressed by me on 10/19/19.  I spoke with the primary RN and Kirtland Bouchard, RN.  I explained the orders were in the chart and where I last saw the supplies he had.  If the fecal ostomy pouches don't hold up to the effluent, then they will need to use a small Eakin pouch. Wound type: Pressure Injury POA: Yes/No/NA Measurement: Wound bed: Drainage (amount, consistency, odor)  Periwound: Dressing procedure/placement/frequency: Helmut Muster, RN, MSN, CWOCN, CNS-BC, pager [phone]

## 2019-10-21 NOTE — Progress Notes (Signed)
Called to room for leaking area right flank, there is a 0.5 x 0.5 cm area that is leaking a golden fluid. Cleansed the area, skin prepped area and applied pouch with barrier ring. Will re-assess for any further leaking.

## 2019-10-21 NOTE — Plan of Care (Signed)
  Problem: Nutrition: Goal: Adequate nutrition will be maintained Outcome: Progressing   Problem: Coping: Goal: Level of anxiety will decrease Outcome: Progressing   Problem: Elimination: Goal: Will not experience complications related to bowel motility Outcome: Progressing Goal: Will not experience complications related to urinary retention Outcome: Progressing   
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
## 2019-10-21 NOTE — Procedures (Addendum)
Interventional Radiology Procedure:   Indications: Needs PICC for TPN  Procedure: Left arm PICC and abdominal drain injection  Findings: Limited access for PICC.  Placed left upper brachial vein PICC, tip at SVC/RA junction.  Drain injection - Irregular abdominal cavity with fistulas to small and large bowel.  Complications: None     EBL: less than 10 ml  Plan: PICC is ready to use.     Lakiya Cottam R. Lowella Dandy, MD  Pager: [phone]
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
## 2019-10-22 LAB — GLUCOSE, CAPILLARY
Glucose-Capillary: 117 mg/dL — ABNORMAL HIGH (ref 70–99)
Glucose-Capillary: 79 mg/dL (ref 70–99)
Glucose-Capillary: 129 mg/dL — ABNORMAL HIGH (ref 70–99)

## 2019-10-22 LAB — CULTURE, BLOOD (ROUTINE X 2)
Culture: NO GROWTH
Culture: NO GROWTH

## 2019-10-22 LAB — COMPREHENSIVE METABOLIC PANEL
ALT: 22 U/L (ref 0–44)
AST: 25 U/L (ref 15–41)
Albumin: 2 g/dL — ABNORMAL LOW (ref 3.5–5.0)
Alkaline Phosphatase: 158 U/L — ABNORMAL HIGH (ref 38–126)
Anion gap: 7 (ref 5–15)
BUN: <5 mg/dL — ABNORMAL LOW (ref 6–20)
CO2: 28 mmol/L (ref 22–32)
Calcium: 8 mg/dL — ABNORMAL LOW (ref 8.9–10.3)
Chloride: 104 mmol/L (ref 98–111)
Creatinine, Ser: 1.19 mg/dL (ref 0.61–1.24)
GFR calc Af Amer: >60 mL/min (ref >60)
GFR calc non Af Amer: >60 mL/min (ref >60)
Glucose, Bld: 103 mg/dL — ABNORMAL HIGH (ref 70–99)
Potassium: 3.8 mmol/L (ref 3.5–5.1)
Sodium: 139 mmol/L (ref 135–145)
Total Bilirubin: 0.3 mg/dL (ref 0.3–1.2)
Total Protein: 5.8 g/dL — ABNORMAL LOW (ref 6.5–8.1)

## 2019-10-22 LAB — MAGNESIUM: Magnesium: 1.8 mg/dL (ref 1.7–2.4)

## 2019-10-22 LAB — PHOSPHORUS: Phosphorus: 3.4 mg/dL (ref 2.5–4.6)

## 2019-10-22 MED ORDER — TRAVASOL 10 % IV SOLN
INTRAVENOUS | Status: AC
  Filled 2019-10-22: qty 480

## 2019-10-22 MED ORDER — BOOST / RESOURCE BREEZE PO LIQD CUSTOM
1.0000 | Freq: Four times a day (QID) | ORAL | Status: DC
  Administered 2019-10-22 – 2019-11-12 (×80): 1 via ORAL
  Filled 2019-10-22 (×10): qty 1

## 2019-10-22 MED ORDER — DEXTROSE IN LACTATED RINGERS 5 % IV SOLN: INTRAVENOUS | Status: AC
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
## 2019-10-22 NOTE — Progress Notes (Signed)
Calorie Count Note  48 hour calorie count ordered.  Diet: soft Supplements: Ensure Enlive TID, Boost Breeze PRN, 72ml Prosource plus BID  Breakfast: 1173 kcal, 25g protein Lunch: 0 kcal, 0g protein Dinner: 852 kcal, 25g protein Supplements: Documented that pt refused Ensure today; however, no documentation regarding other supplements available. Previously documented that pt likes [employer], so it is likely pt is consuming these.   Total intake: 2025 kcal (100 % of minimum estimated needs)  50 protein (55% of minimum estimated needs)  Nutrition Dx: Severe Malnutrition related to acute illness (new enterocutaneous fistula to right flank) as evidenced by energy intake < or equal to 50% for > or equal to 5 days, moderate fat depletion, moderate muscle depletion, percent weight loss (20.8% weight loss in less than 2 months).  Goal: Patient will meet greater than or equal to 90% of their needs  Intervention:  -d/c Ensure Enlive  -Boost Breeze po QID, each supplement provides 250 kcal and 9 grams of protein -Magic cup TID with meals, each supplement provides 290 kcal and 9 grams of protein -Continue 75ml Prosource po BID, each supplement provides 100 kcal and 15 grams of protein  Eugene Gavia, MS, RD, LDN RD pager number and weekend/on-call pager number located in [place].

## 2019-10-22 NOTE — Progress Notes (Signed)
For hospital MD,per family, pt is on trazadone, robaxin,florinef and midodrine at home and is not currently getting these meds in hospital, please evaluate

## 2019-10-23 LAB — BASIC METABOLIC PANEL
Anion gap: 6 (ref 5–15)
BUN: 7 mg/dL (ref 6–20)
CO2: 28 mmol/L (ref 22–32)
Calcium: 8.1 mg/dL — ABNORMAL LOW (ref 8.9–10.3)
Chloride: 104 mmol/L (ref 98–111)
Creatinine, Ser: 1.13 mg/dL (ref 0.61–1.24)
GFR calc Af Amer: >60 mL/min (ref >60)
GFR calc non Af Amer: >60 mL/min (ref >60)
Glucose, Bld: 106 mg/dL — ABNORMAL HIGH (ref 70–99)
Potassium: 3.8 mmol/L (ref 3.5–5.1)
Sodium: 138 mmol/L (ref 135–145)

## 2019-10-23 LAB — GLUCOSE, CAPILLARY
Glucose-Capillary: 108 mg/dL — ABNORMAL HIGH (ref 70–99)
Glucose-Capillary: 111 mg/dL — ABNORMAL HIGH (ref 70–99)
Glucose-Capillary: 111 mg/dL — ABNORMAL HIGH (ref 70–99)
Glucose-Capillary: 127 mg/dL — ABNORMAL HIGH (ref 70–99)
Glucose-Capillary: 92 mg/dL (ref 70–99)

## 2019-10-23 LAB — PHOSPHORUS: Phosphorus: 3.1 mg/dL (ref 2.5–4.6)

## 2019-10-23 LAB — MAGNESIUM: Magnesium: 1.8 mg/dL (ref 1.7–2.4)

## 2019-10-23 MED ORDER — TRAVASOL 10 % IV SOLN
INTRAVENOUS | Status: AC
  Filled 2019-10-23: qty 720

## 2019-10-23 MED ORDER — DEXTROSE IN LACTATED RINGERS 5 % IV SOLN: INTRAVENOUS | Status: DC

## 2019-10-23 MED ORDER — HYDROMORPHONE HCL 2 MG PO TABS: 2.0000 mg | ORAL_TABLET | Freq: Three times a day (TID) | ORAL | Status: DC | PRN

## 2019-10-23 MED ORDER — OXYCODONE HCL 5 MG PO TABS
5.0000 mg | ORAL_TABLET | ORAL | Status: DC | PRN
  Administered 2019-10-23 – 2019-11-09 (×77): 10 mg via ORAL
  Filled 2019-10-23 (×80): qty 2

## 2019-10-23 NOTE — Progress Notes (Signed)
Patient refused changing of his ostomy pouch. Supplies were ordered and are at the bedside.

## 2019-10-23 NOTE — Plan of Care (Signed)
  Problem: Clinical Measurements: Goal: Ability to maintain clinical measurements within normal limits will improve Outcome: Progressing   Problem: Nutrition: Goal: Adequate nutrition will be maintained Outcome: Progressing   Problem: Pain Managment: Goal: General experience of comfort will improve Outcome: Progressing   
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
## 2019-10-23 NOTE — Plan of Care (Signed)
  Problem: Nutrition: Goal: Adequate nutrition will be maintained Outcome: Progressing   Problem: Elimination: Goal: Will not experience complications related to bowel motility Outcome: Progressing   
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
## 2019-10-23 NOTE — Progress Notes (Signed)
Calorie Count Note  48 hour calorie count ordered.  Spoke with pt who reports that he did not eat Breakfast this am because he ate a lot over night (family brought in take out). We reviewed supplement options. Pt is not willing to try ProSource Plus and does not like Ensure Enlive, milk, magic cup. He will only drink the [employer].  Currently pt consuming adequate nutrition with supplements. However per RN pt's intake has been inconsistent. Fistula/ostomy output being monitored as this can impact absorption of nutrients.   Explained importance of adequate oral nutrition. Noted Palliative Care to see pt.    Diet: Soft Supplements: Boost Breeze QID, ProSource Plus BID  7/16 Breakfast: 0 Lunch: 650 kcal, 20 grams protein Dinner: 960 kcal, 30 grams protein (take out from family) Supplements: 1000 kcal and 36 grams protein   Total intake: 2610 kcal (>100% of minimum estimated needs)  86 protein (86% of minimum estimated needs)  Nutrition Dx: Severe Malnutrition related to acute illness (new enterocutaneous fistula to right flank) as evidenced by energy intake < or equal to 50% for > or equal to 5 days, moderate fat depletion, moderate muscle depletion, percent weight loss (20.8% weight loss in less than 2 months).  Goal: Patient will meet greater than or equal to 90% of their needs  Intervention:  Boost Breeze po QID, each supplement provides 250 kcal and 9 grams of protein   Brandon Funches P., RD, LDN, CNSC See AMiON for contact information
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
## 2019-10-24 LAB — GLUCOSE, CAPILLARY
Glucose-Capillary: 121 mg/dL — ABNORMAL HIGH (ref 70–99)
Glucose-Capillary: 87 mg/dL (ref 70–99)
Glucose-Capillary: 97 mg/dL (ref 70–99)

## 2019-10-24 LAB — MAGNESIUM: Magnesium: 1.7 mg/dL (ref 1.7–2.4)

## 2019-10-24 LAB — BASIC METABOLIC PANEL
Anion gap: 8 (ref 5–15)
BUN: 19 mg/dL (ref 6–20)
CO2: 21 mmol/L — ABNORMAL LOW (ref 22–32)
Calcium: 8.7 mg/dL — ABNORMAL LOW (ref 8.9–10.3)
Chloride: 104 mmol/L (ref 98–111)
Creatinine, Ser: 1.21 mg/dL (ref 0.61–1.24)
GFR calc Af Amer: >60 mL/min (ref >60)
GFR calc non Af Amer: >60 mL/min (ref >60)
Glucose, Bld: 101 mg/dL — ABNORMAL HIGH (ref 70–99)
Potassium: 4.1 mmol/L (ref 3.5–5.1)
Sodium: 133 mmol/L — ABNORMAL LOW (ref 135–145)

## 2019-10-24 LAB — PHOSPHORUS: Phosphorus: 3.7 mg/dL (ref 2.5–4.6)

## 2019-10-24 MED ORDER — TRAVASOL 10 % IV SOLN
INTRAVENOUS | Status: AC
  Filled 2019-10-24: qty 720
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
## 2019-10-24 NOTE — Progress Notes (Signed)
bp  86/48 MD made aware

## 2019-10-24 NOTE — Progress Notes (Signed)
Pt MEWS turned yellow. Dr. Loann Quill, Kshitiz was made aware that pt's heart rate elevates to 150's (sinus tach) with activities and is 1teens with inactivities. Pt is stable and has no compliants. Dr. Hanley Ben, Kshitiz said he will order EKG.
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
## 2019-10-25 DIAGNOSIS — N179 Acute kidney failure, unspecified: Secondary | ICD-10-CM

## 2019-10-25 DIAGNOSIS — Z515 Encounter for palliative care: Secondary | ICD-10-CM

## 2019-10-25 DIAGNOSIS — K632 Fistula of intestine: Principal | ICD-10-CM

## 2019-10-25 DIAGNOSIS — G8921 Chronic pain due to trauma: Secondary | ICD-10-CM

## 2019-10-25 LAB — GLUCOSE, CAPILLARY
Glucose-Capillary: 100 mg/dL — ABNORMAL HIGH (ref 70–99)
Glucose-Capillary: 108 mg/dL — ABNORMAL HIGH (ref 70–99)
Glucose-Capillary: 93 mg/dL (ref 70–99)
Glucose-Capillary: 95 mg/dL (ref 70–99)

## 2019-10-25 MED ORDER — TRAVASOL 10 % IV SOLN
INTRAVENOUS | Status: AC
  Filled 2019-10-25: qty 720

## 2019-10-25 NOTE — Progress Notes (Signed)
Assisted pt to change ileostomy 2 piece system which he does by himself.
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
## 2019-10-25 NOTE — Progress Notes (Signed)
Pt has been awake entire shift, frequently putting on call light, refuses to keep Tele on and refuses to wear any clothes. I have changed back dsg x2 so far this shift and he pulled off dsg himself within 1 hour of each other.
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
## 2019-10-26 LAB — DIFFERENTIAL
Abs Immature Granulocytes: 0.02 10*3/uL (ref 0.00–0.07)
Basophils Absolute: 0 10*3/uL (ref 0.0–0.1)
Basophils Relative: 1 %
Eosinophils Absolute: 0.3 10*3/uL (ref 0.0–0.5)
Eosinophils Relative: 5 %
Immature Granulocytes: 0 %
Lymphocytes Relative: 13 %
Lymphs Abs: 0.8 10*3/uL (ref 0.7–4.0)
Monocytes Absolute: 0.5 10*3/uL (ref 0.1–1.0)
Monocytes Relative: 8 %
Neutro Abs: 4.7 10*3/uL (ref 1.7–7.7)
Neutrophils Relative %: 73 %

## 2019-10-26 LAB — CBC
HCT: 28.4 % — ABNORMAL LOW (ref 39.0–52.0)
Hemoglobin: 8.4 g/dL — ABNORMAL LOW (ref 13.0–17.0)
MCH: 26.3 pg (ref 26.0–34.0)
MCHC: 29.6 g/dL — ABNORMAL LOW (ref 30.0–36.0)
MCV: 88.8 fL (ref 80.0–100.0)
Platelets: 204 10*3/uL (ref 150–400)
RBC: 3.2 MIL/uL — ABNORMAL LOW (ref 4.22–5.81)
RDW: 18.2 % — ABNORMAL HIGH (ref 11.5–15.5)
WBC: 6.4 10*3/uL (ref 4.0–10.5)
nRBC: 0 % (ref 0.0–0.2)

## 2019-10-26 LAB — COMPREHENSIVE METABOLIC PANEL
ALT: 26 U/L (ref 0–44)
AST: 25 U/L (ref 15–41)
Albumin: 2.3 g/dL — ABNORMAL LOW (ref 3.5–5.0)
Alkaline Phosphatase: 183 U/L — ABNORMAL HIGH (ref 38–126)
Anion gap: 5 (ref 5–15)
BUN: 19 mg/dL (ref 6–20)
CO2: 24 mmol/L (ref 22–32)
Calcium: 8.5 mg/dL — ABNORMAL LOW (ref 8.9–10.3)
Chloride: 106 mmol/L (ref 98–111)
Creatinine, Ser: 1.19 mg/dL (ref 0.61–1.24)
GFR calc Af Amer: >60 mL/min (ref >60)
GFR calc non Af Amer: >60 mL/min (ref >60)
Glucose, Bld: 82 mg/dL (ref 70–99)
Potassium: 4.6 mmol/L (ref 3.5–5.1)
Sodium: 135 mmol/L (ref 135–145)
Total Bilirubin: 0.6 mg/dL (ref 0.3–1.2)
Total Protein: 6.6 g/dL (ref 6.5–8.1)

## 2019-10-26 LAB — PREALBUMIN: Prealbumin: 9.8 mg/dL — ABNORMAL LOW (ref 18–38)

## 2019-10-26 LAB — GLUCOSE, CAPILLARY
Glucose-Capillary: 106 mg/dL — ABNORMAL HIGH (ref 70–99)
Glucose-Capillary: 90 mg/dL (ref 70–99)
Glucose-Capillary: 97 mg/dL (ref 70–99)
Glucose-Capillary: 128 mg/dL — ABNORMAL HIGH (ref 70–99)

## 2019-10-26 LAB — PHOSPHORUS: Phosphorus: 4.6 mg/dL (ref 2.5–4.6)

## 2019-10-26 LAB — MAGNESIUM: Magnesium: 2 mg/dL (ref 1.7–2.4)

## 2019-10-26 LAB — TRIGLYCERIDES: Triglycerides: 99 mg/dL (ref <150)

## 2019-10-26 MED ORDER — TRAVASOL 10 % IV SOLN
INTRAVENOUS | Status: AC
  Filled 2019-10-26: qty 720

## 2019-10-26 MED ORDER — INSULIN ASPART 100 UNIT/ML ~~LOC~~ SOLN
0.0000 [IU] | Freq: Two times a day (BID) | SUBCUTANEOUS | Status: DC
  Administered 2019-10-26: 1 [IU] via SUBCUTANEOUS

## 2019-10-26 NOTE — Progress Notes (Signed)
Explained to patient that it is very important that we keep measurements of his intake and output. I provided patient a graduated cylinder and asked him to empty the contents of his ostomy into it so that we can keep count. Patient verbalizes understanding and says "yes" when asked if he will do this for [place].

## 2019-10-26 NOTE — Progress Notes (Signed)
Calorie Count Note  48 hour calorie count ordered.  Diet: Soft Supplements: Boost Breeze QID, ProSource Plus BID, Magic cup TID with meals, each supplement provides 290 kcal and 9 grams of protein  Pt receiving nursing care at time of visit. Per nurse tech, pt consumed no breakfast this morning, only consumed a cup of juice from the floor. Per nurse tech, pt will often consume outside food brought in by family members, however, no family has been in to visit family today.   Per pharmacy note, plan to continue PO diet and TPN per general surgery. Per general surgery notes, I/O's inaccurate. Output of drain and ileostomy may inhibit absorption of PO intake/nutrients.   7/14 Breakfast: 1173 kcal, 25g protein Lunch: 0 kcal, 0g protein Dinner: 852 kcal, 25g protein Supplements: Documented that pt refused Ensure today; however, no documentation regarding other supplements available. Previously documented that pt likes [employer], so it is likely pt is consuming these.   Total intake: 2025 kcal (100% of minimum estimated needs)  50 protein (55% of minimum estimated needs)  7/15 Breakfast: 0 Lunch: 650 kcal, 20 grams protein Dinner: 960 kcal, 30 grams protein (take out from family) Supplements: 1000 kcal and 36 grams protein   Total intake: 2610 kcal (>100% of minimum estimated needs)  86 protein (86% of minimum estimated needs)  7/16 Breakfast: 125 kcals, 5 grams protein Lunch: 203 kcals, 20 grams protein Dinner: 783 kcals, 45 grams protein  Total intake: 1111 kcal (56% of minimum estimated needs)  70 grams protein (78% of minimum estimated needs)  7/17 Breakfast: nothing documented Lunch: nothing documented Dinner: 1020 kcals, 32 grams protein (utside food brought in by family)  Total intake: 1020 kcal (51% of minimum estimated needs)  32 grams protein (36% of minimum estimated needs)  7/18 Breakfast: 41 kcals, 3 grams protein  Total intake: 41 kcal (2% of minimum  estimated needs)  3 grams protein (3% of minimum estimated needs)  Average Total intake: 1361 kcal (68% of minimum estimated needs)  48 grams protein (54% of minimum estimated needs)  Nutrition Dx: Severe Malnutritionrelated to acute illness (new enterocutaneous fistula to right flank)as evidenced by energy intake < or equal to 50% for > or equal to 5 days, moderate fat depletion, moderate muscle depletion, percent weight loss (20.8% weight loss in less than 2 months).  Goal: Patient will meet greater than or equal to 90% of their needs  Intervention:  -D/c calorie count -D/c Prostat, due to poor acceptance -D/c Magic Cup due to poor acceptance -Continue Boost Breeze po QID, each supplement provides 250 kcal and 9 grams of protein -TPN management per pharmacy  Levada Schilling, RD, LDN, CDCES Registered Dietitian II Certified Diabetes Care and Education Specialist Please refer to [facility] for RD and/or RD on-call/weekend/after hours pager
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
## 2019-10-27 LAB — BASIC METABOLIC PANEL
Anion gap: 7 (ref 5–15)
BUN: 20 mg/dL (ref 6–20)
CO2: 23 mmol/L (ref 22–32)
Calcium: 8.5 mg/dL — ABNORMAL LOW (ref 8.9–10.3)
Chloride: 104 mmol/L (ref 98–111)
Creatinine, Ser: 1.58 mg/dL — ABNORMAL HIGH (ref 0.61–1.24)
GFR calc Af Amer: >60 mL/min (ref >60)
GFR calc non Af Amer: 58 mL/min — ABNORMAL LOW (ref >60)
Glucose, Bld: 94 mg/dL (ref 70–99)
Potassium: 4.3 mmol/L (ref 3.5–5.1)
Sodium: 134 mmol/L — ABNORMAL LOW (ref 135–145)

## 2019-10-27 LAB — GLUCOSE, CAPILLARY
Glucose-Capillary: 92 mg/dL (ref 70–99)
Glucose-Capillary: 107 mg/dL — ABNORMAL HIGH (ref 70–99)

## 2019-10-27 MED ORDER — TRAVASOL 10 % IV SOLN
INTRAVENOUS | Status: AC
  Filled 2019-10-27: qty 720
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
## 2019-10-28 LAB — BASIC METABOLIC PANEL
Anion gap: 6 (ref 5–15)
BUN: 17 mg/dL (ref 6–20)
CO2: 22 mmol/L (ref 22–32)
Calcium: 8.4 mg/dL — ABNORMAL LOW (ref 8.9–10.3)
Chloride: 108 mmol/L (ref 98–111)
Creatinine, Ser: 1.26 mg/dL — ABNORMAL HIGH (ref 0.61–1.24)
GFR calc Af Amer: >60 mL/min (ref >60)
GFR calc non Af Amer: >60 mL/min (ref >60)
Glucose, Bld: 104 mg/dL — ABNORMAL HIGH (ref 70–99)
Potassium: 4.2 mmol/L (ref 3.5–5.1)
Sodium: 136 mmol/L (ref 135–145)

## 2019-10-28 LAB — GLUCOSE, CAPILLARY: Glucose-Capillary: 95 mg/dL (ref 70–99)

## 2019-10-28 MED ORDER — SIMETHICONE 80 MG PO CHEW
80.0000 mg | CHEWABLE_TABLET | Freq: Four times a day (QID) | ORAL | Status: DC | PRN
  Administered 2019-10-29 – 2019-10-30 (×2): 80 mg via ORAL
  Filled 2019-10-28 (×3): qty 1

## 2019-10-28 MED ORDER — TRAVASOL 10 % IV SOLN
INTRAVENOUS | Status: AC
  Filled 2019-10-28: qty 720
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
## 2019-10-29 DIAGNOSIS — E46 Unspecified protein-calorie malnutrition: Secondary | ICD-10-CM

## 2019-10-29 LAB — MAGNESIUM: Magnesium: 1.9 mg/dL (ref 1.7–2.4)

## 2019-10-29 LAB — COMPREHENSIVE METABOLIC PANEL
ALT: 19 U/L (ref 0–44)
AST: 19 U/L (ref 15–41)
Albumin: 2.1 g/dL — ABNORMAL LOW (ref 3.5–5.0)
Alkaline Phosphatase: 168 U/L — ABNORMAL HIGH (ref 38–126)
Anion gap: 4 — ABNORMAL LOW (ref 5–15)
BUN: 14 mg/dL (ref 6–20)
CO2: 24 mmol/L (ref 22–32)
Calcium: 8.4 mg/dL — ABNORMAL LOW (ref 8.9–10.3)
Chloride: 110 mmol/L (ref 98–111)
Creatinine, Ser: 1.29 mg/dL — ABNORMAL HIGH (ref 0.61–1.24)
GFR calc Af Amer: >60 mL/min (ref >60)
GFR calc non Af Amer: >60 mL/min (ref >60)
Glucose, Bld: 105 mg/dL — ABNORMAL HIGH (ref 70–99)
Potassium: 4.1 mmol/L (ref 3.5–5.1)
Sodium: 138 mmol/L (ref 135–145)
Total Bilirubin: 0.5 mg/dL (ref 0.3–1.2)
Total Protein: 6.4 g/dL — ABNORMAL LOW (ref 6.5–8.1)

## 2019-10-29 LAB — PHOSPHORUS: Phosphorus: 4.2 mg/dL (ref 2.5–4.6)

## 2019-10-29 MED ORDER — TRAVASOL 10 % IV SOLN
INTRAVENOUS | Status: AC
  Filled 2019-10-29: qty 720

## 2019-10-29 NOTE — TOC Progression Note (Signed)
Transition of Care [facility]) - Progression Note    Patient Details  Name: Brandon Moyer MRN: [id number] Date of Birth: [birth year]  [facility] of [facility]) CM/SW Contact  Yvanna Vidas, Adria Devon, RN Phone Number: 10/29/2019, 3:40 PM  Clinical Narrative:      Have asked financial counselor to speak with patient       Expected Discharge Plan and Services     Discharge Planning Services: CM Consult   Living arrangements for the past 2 months: Single Family Home                                       Social Determinants of Health (SDOH) Interventions    Readmission Risk Interventions No flowsheet data found.
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
## 2019-10-29 NOTE — Progress Notes (Signed)
   10/29/19 1528  Clinical Encounter Type  Visited With Patient  Visit Type Initial;Spiritual support  Referral From Palliative care team  Consult/Referral To Chaplain  This chaplain responded to PMT consult for spiritual care. The chaplain introduced herself to the Pt. and to the role of spiritual care on PMT.  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 The Pt depends on his brothers for emotional support outside the home. Looking forward, the Pt. wants to spend more time with [name] who is 30 years old and has just starting playing soccer.  The Pt. is open to F/U visits from the chaplain.

## 2019-10-30 LAB — BASIC METABOLIC PANEL
Anion gap: 6 (ref 5–15)
BUN: 13 mg/dL (ref 6–20)
CO2: 23 mmol/L (ref 22–32)
Calcium: 8.4 mg/dL — ABNORMAL LOW (ref 8.9–10.3)
Chloride: 107 mmol/L (ref 98–111)
Creatinine, Ser: 1.18 mg/dL (ref 0.61–1.24)
GFR calc Af Amer: >60 mL/min (ref >60)
GFR calc non Af Amer: >60 mL/min (ref >60)
Glucose, Bld: 112 mg/dL — ABNORMAL HIGH (ref 70–99)
Potassium: 3.9 mmol/L (ref 3.5–5.1)
Sodium: 136 mmol/L (ref 135–145)

## 2019-10-30 MED ORDER — ALUM & MAG HYDROXIDE-SIMETH 200-200-20 MG/5ML PO SUSP
15.0000 mL | Freq: Four times a day (QID) | ORAL | Status: DC | PRN
  Filled 2019-10-30: qty 30

## 2019-10-30 MED ORDER — TRAVASOL 10 % IV SOLN
INTRAVENOUS | Status: AC
  Filled 2019-10-30: qty 720
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
## 2019-10-30 NOTE — Progress Notes (Signed)
Continue TPN, soft diet, and strict I&O's. Appreciate dietician assistance. No urgent surgical intervention recommended. We will follow up next week. Recheck prealbumin on Monday.  Franne Forts, PA-C [facility] 10/30/2019, 8:00 AM Please see Amion for pager number during day hours 7:00am-4:30pm
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
## 2019-10-30 NOTE — TOC CAGE-AID Note (Signed)
Transition of Care [facility]) - CAGE-AID Screening   Patient Details  Name: Brandon Moyer MRN: [id number] Date of Birth: 04-01-1991  Transition of [facility]) CM/SW Contact:    Jimmy Picket, LCSWA Phone Number: 10/30/2019, 3:12 PM   Clinical Narrative:  CSW attempted to do assessment with pt. CSW introduced herself and told her role. Pt stated "I dont need anything, I dont need anything" pt did not want to answer any questions.   CAGE-AID Screening: Substance Abuse Screening unable to be completed due to: : Patient Refused               Jimmy Picket, Bryon Lions Clinical Social Worker [phone]
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
## 2019-10-31 LAB — GLUCOSE, CAPILLARY: Glucose-Capillary: 99 mg/dL (ref 70–99)

## 2019-10-31 MED ORDER — TRAVASOL 10 % IV SOLN
INTRAVENOUS | Status: AC
  Filled 2019-10-31: qty 720
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
## 2019-10-31 NOTE — Progress Notes (Signed)
Dressing change to chest done this morning. Pt has been declining dressing changes by nursing staff but clearly this wound requires nursing input. Pus like drainage noted soaking the adaptic beneath the foam dressing but otherwise wound itself looked clean with healthy granulating tissue once it was cleaned. Pt educated on importance of ordered dressing changes

## 2019-10-31 NOTE — Plan of Care (Signed)
  Problem: Education: Goal: Knowledge of General Education information will improve Description: Including pain rating scale, medication(s)/side effects and non-pharmacologic comfort measures Outcome: Progressing   Problem: Clinical Measurements: Goal: Ability to maintain clinical measurements within normal limits will improve Outcome: Progressing Goal: Will remain free from infection Outcome: Progressing   Problem: Clinical Measurements: Goal: Ability to maintain clinical measurements within normal limits will improve Outcome: Progressing Goal: Will remain free from infection Outcome: Progressing
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
## 2019-11-01 MED ORDER — HYDROMORPHONE HCL 1 MG/ML IJ SOLN
1.0000 mg | INTRAMUSCULAR | Status: DC | PRN
  Administered 2019-11-01 – 2019-11-09 (×37): 1 mg via INTRAVENOUS
  Filled 2019-11-01 (×39): qty 1

## 2019-11-01 MED ORDER — TRAVASOL 10 % IV SOLN
INTRAVENOUS | Status: AC
  Filled 2019-11-01: qty 720
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
## 2019-11-02 DIAGNOSIS — E43 Unspecified severe protein-calorie malnutrition: Secondary | ICD-10-CM

## 2019-11-02 LAB — DIFFERENTIAL
Abs Immature Granulocytes: 0.02 10*3/uL (ref 0.00–0.07)
Basophils Absolute: 0 10*3/uL (ref 0.0–0.1)
Basophils Relative: 0 %
Eosinophils Absolute: 0.4 10*3/uL (ref 0.0–0.5)
Eosinophils Relative: 8 %
Immature Granulocytes: 0 %
Lymphocytes Relative: 24 %
Lymphs Abs: 1.1 10*3/uL (ref 0.7–4.0)
Monocytes Absolute: 0.4 10*3/uL (ref 0.1–1.0)
Monocytes Relative: 10 %
Neutro Abs: 2.6 10*3/uL (ref 1.7–7.7)
Neutrophils Relative %: 58 %

## 2019-11-02 LAB — COMPREHENSIVE METABOLIC PANEL
ALT: 12 U/L (ref 0–44)
AST: 18 U/L (ref 15–41)
Albumin: 2.4 g/dL — ABNORMAL LOW (ref 3.5–5.0)
Alkaline Phosphatase: 138 U/L — ABNORMAL HIGH (ref 38–126)
Anion gap: 6 (ref 5–15)
BUN: 14 mg/dL (ref 6–20)
CO2: 23 mmol/L (ref 22–32)
Calcium: 8.7 mg/dL — ABNORMAL LOW (ref 8.9–10.3)
Chloride: 108 mmol/L (ref 98–111)
Creatinine, Ser: 1.31 mg/dL — ABNORMAL HIGH (ref 0.61–1.24)
GFR calc Af Amer: >60 mL/min (ref >60)
GFR calc non Af Amer: >60 mL/min (ref >60)
Glucose, Bld: 113 mg/dL — ABNORMAL HIGH (ref 70–99)
Potassium: 4 mmol/L (ref 3.5–5.1)
Sodium: 137 mmol/L (ref 135–145)
Total Bilirubin: 0.3 mg/dL (ref 0.3–1.2)
Total Protein: 7.1 g/dL (ref 6.5–8.1)

## 2019-11-02 LAB — CBC
HCT: 30.6 % — ABNORMAL LOW (ref 39.0–52.0)
Hemoglobin: 9.2 g/dL — ABNORMAL LOW (ref 13.0–17.0)
MCH: 26.3 pg (ref 26.0–34.0)
MCHC: 30.1 g/dL (ref 30.0–36.0)
MCV: 87.4 fL (ref 80.0–100.0)
Platelets: 288 10*3/uL (ref 150–400)
RBC: 3.5 MIL/uL — ABNORMAL LOW (ref 4.22–5.81)
RDW: 18.2 % — ABNORMAL HIGH (ref 11.5–15.5)
WBC: 4.6 10*3/uL (ref 4.0–10.5)
nRBC: 0 % (ref 0.0–0.2)

## 2019-11-02 LAB — PHOSPHORUS: Phosphorus: 4.4 mg/dL (ref 2.5–4.6)

## 2019-11-02 LAB — TRIGLYCERIDES: Triglycerides: 67 mg/dL (ref <150)

## 2019-11-02 LAB — MAGNESIUM: Magnesium: 2 mg/dL (ref 1.7–2.4)

## 2019-11-02 LAB — PREALBUMIN: Prealbumin: 14.9 mg/dL — ABNORMAL LOW (ref 18–38)

## 2019-11-02 MED ORDER — TRAVASOL 10 % IV SOLN
INTRAVENOUS | Status: AC
  Filled 2019-11-02: qty 720
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
## 2019-11-03 LAB — BASIC METABOLIC PANEL
Anion gap: 5 (ref 5–15)
BUN: 11 mg/dL (ref 6–20)
CO2: 25 mmol/L (ref 22–32)
Calcium: 8.7 mg/dL — ABNORMAL LOW (ref 8.9–10.3)
Chloride: 108 mmol/L (ref 98–111)
Creatinine, Ser: 1.36 mg/dL — ABNORMAL HIGH (ref 0.61–1.24)
GFR calc Af Amer: >60 mL/min (ref >60)
GFR calc non Af Amer: >60 mL/min (ref >60)
Glucose, Bld: 105 mg/dL — ABNORMAL HIGH (ref 70–99)
Potassium: 4 mmol/L (ref 3.5–5.1)
Sodium: 138 mmol/L (ref 135–145)

## 2019-11-03 MED ORDER — TRAVASOL 10 % IV SOLN
INTRAVENOUS | Status: AC
  Filled 2019-11-03: qty 720
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
## 2019-11-04 DIAGNOSIS — L02212 Cutaneous abscess of back [any part, except buttock]: Secondary | ICD-10-CM

## 2019-11-04 LAB — BASIC METABOLIC PANEL
Anion gap: 7 (ref 5–15)
BUN: 14 mg/dL (ref 6–20)
CO2: 25 mmol/L (ref 22–32)
Calcium: 8.5 mg/dL — ABNORMAL LOW (ref 8.9–10.3)
Chloride: 107 mmol/L (ref 98–111)
Creatinine, Ser: 1.23 mg/dL (ref 0.61–1.24)
GFR calc Af Amer: >60 mL/min (ref >60)
GFR calc non Af Amer: >60 mL/min (ref >60)
Glucose, Bld: 108 mg/dL — ABNORMAL HIGH (ref 70–99)
Potassium: 3.5 mmol/L (ref 3.5–5.1)
Sodium: 139 mmol/L (ref 135–145)

## 2019-11-04 MED ORDER — TRAVASOL 10 % IV SOLN
INTRAVENOUS | Status: AC
  Filled 2019-11-04: qty 720

## 2019-11-04 MED ORDER — SODIUM CHLORIDE 0.9% FLUSH
10.0000 mL | Freq: Two times a day (BID) | INTRAVENOUS | Status: DC
  Administered 2019-11-04 – 2019-11-13 (×14): 10 mL
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
## 2019-11-04 NOTE — Plan of Care (Signed)
  Problem: Education: Goal: Knowledge of General Education information will improve Description: Including pain rating scale, medication(s)/side effects and non-pharmacologic comfort measures Outcome: Progressing  Aeb pt verbalizes poc this AM Problem: Health Behavior/Discharge Planning: Goal: Ability to manage health-related needs will improve Outcome: Progressing  Aeb pt participates in poc Problem: Clinical Measurements: Goal: Ability to maintain clinical measurements within normal limits will improve Outcome: Progressing  Aeb nutrition lab indicators improving/vss
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
## 2019-11-05 LAB — COMPREHENSIVE METABOLIC PANEL
ALT: 16 U/L (ref 0–44)
AST: 21 U/L (ref 15–41)
Albumin: 2.7 g/dL — ABNORMAL LOW (ref 3.5–5.0)
Alkaline Phosphatase: 148 U/L — ABNORMAL HIGH (ref 38–126)
Anion gap: 7 (ref 5–15)
BUN: 17 mg/dL (ref 6–20)
CO2: 27 mmol/L (ref 22–32)
Calcium: 9 mg/dL (ref 8.9–10.3)
Chloride: 104 mmol/L (ref 98–111)
Creatinine, Ser: 1.5 mg/dL — ABNORMAL HIGH (ref 0.61–1.24)
GFR calc Af Amer: >60 mL/min (ref >60)
GFR calc non Af Amer: >60 mL/min (ref >60)
Glucose, Bld: 99 mg/dL (ref 70–99)
Potassium: 3.9 mmol/L (ref 3.5–5.1)
Sodium: 138 mmol/L (ref 135–145)
Total Bilirubin: 0.6 mg/dL (ref 0.3–1.2)
Total Protein: 7.9 g/dL (ref 6.5–8.1)

## 2019-11-05 LAB — PHOSPHORUS: Phosphorus: 4.8 mg/dL — ABNORMAL HIGH (ref 2.5–4.6)

## 2019-11-05 LAB — MAGNESIUM: Magnesium: 2 mg/dL (ref 1.7–2.4)

## 2019-11-05 MED ORDER — TRAVASOL 10 % IV SOLN
INTRAVENOUS | Status: AC
  Filled 2019-11-05: qty 720
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
## 2019-11-05 NOTE — Progress Notes (Signed)
°   11/05/19 1259  Clinical Encounter Type  Visited With Patient  Visit Type Follow-up;Spiritual support  Referral From Chaplain  Consult/Referral To Chaplain  Spiritual Encounters  Spiritual Needs Prayer;Sacred text  This chaplain was present with the Pt. for spiritual care follow up.  The chaplain checked in with the RN-Tina before the visit. The Pt. is sitting up in his chair preparing to order lunch when the chaplain visits. The chaplain asked the Pt. for an update on the Pt. daughter's new interest in soccer.  The Pt. shared he remains curious and committed to communicating with his daughter, but he has no updates.    The Pt. shared his interest in Christianity and choice to read and understand scripture with the chaplain. The spiritual theme centers on relationship and how to honor differences in a spirit of love. The Pt. accepts prayer and an invitation to follow up with spiritual care.
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
## 2019-11-06 LAB — BASIC METABOLIC PANEL
Anion gap: 6 (ref 5–15)
BUN: 18 mg/dL (ref 6–20)
CO2: 25 mmol/L (ref 22–32)
Calcium: 8.5 mg/dL — ABNORMAL LOW (ref 8.9–10.3)
Chloride: 107 mmol/L (ref 98–111)
Creatinine, Ser: 1.57 mg/dL — ABNORMAL HIGH (ref 0.61–1.24)
GFR calc Af Amer: >60 mL/min (ref >60)
GFR calc non Af Amer: 58 mL/min — ABNORMAL LOW (ref >60)
Glucose, Bld: 104 mg/dL — ABNORMAL HIGH (ref 70–99)
Potassium: 4.2 mmol/L (ref 3.5–5.1)
Sodium: 138 mmol/L (ref 135–145)

## 2019-11-06 MED ORDER — TRAVASOL 10 % IV SOLN
INTRAVENOUS | Status: AC
  Filled 2019-11-06: qty 720
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
## 2019-11-06 NOTE — Care Management (Addendum)
Spoke to [employer] , at this time patient is not eligible for medicaid due to the face he does not have a diagnosis to support him being disabled for 12 months.    Ronny Flurry RN

## 2019-11-07 LAB — PHOSPHORUS: Phosphorus: 4 mg/dL (ref 2.5–4.6)

## 2019-11-07 LAB — BASIC METABOLIC PANEL
Anion gap: 5 (ref 5–15)
BUN: 19 mg/dL (ref 6–20)
CO2: 25 mmol/L (ref 22–32)
Calcium: 8.3 mg/dL — ABNORMAL LOW (ref 8.9–10.3)
Chloride: 108 mmol/L (ref 98–111)
Creatinine, Ser: 1.32 mg/dL — ABNORMAL HIGH (ref 0.61–1.24)
GFR calc Af Amer: >60 mL/min (ref >60)
GFR calc non Af Amer: >60 mL/min (ref >60)
Glucose, Bld: 101 mg/dL — ABNORMAL HIGH (ref 70–99)
Potassium: 4.1 mmol/L (ref 3.5–5.1)
Sodium: 138 mmol/L (ref 135–145)

## 2019-11-07 LAB — MAGNESIUM: Magnesium: 1.9 mg/dL (ref 1.7–2.4)

## 2019-11-07 MED ORDER — TRAVASOL 10 % IV SOLN
INTRAVENOUS | Status: AC
  Filled 2019-11-07: qty 720

## 2019-11-07 MED ORDER — METHYLPREDNISOLONE SODIUM SUCC 40 MG IJ SOLR
40.0000 mg | Freq: Once | INTRAMUSCULAR | Status: AC
  Administered 2019-11-07: 40 mg via INTRAVENOUS
  Filled 2019-11-07: qty 1
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
## 2019-11-07 NOTE — Plan of Care (Signed)
  Problem: Clinical Measurements: Goal: Ability to maintain clinical measurements within normal limits will improve Outcome: Progressing Goal: Will remain free from infection Outcome: Progressing Goal: Diagnostic test results will improve Outcome: Progressing Goal: Respiratory complications will improve Outcome: Progressing Goal: Cardiovascular complication will be avoided Outcome: Progressing   Problem: Activity: Goal: Risk for activity intolerance will decrease Outcome: Progressing   Problem: Coping: Goal: Level of anxiety will decrease Outcome: Progressing   Problem: Nutrition: Goal: Adequate nutrition will be maintained Outcome: Progressing   Problem: Elimination: Goal: Will not experience complications related to bowel motility Outcome: Progressing Goal: Will not experience complications related to urinary retention Outcome: Progressing   Problem: Pain Managment: Goal: General experience of comfort will improve Outcome: Progressing   Problem: Safety: Goal: Ability to remain free from injury will improve Outcome: Progressing   Problem: Skin Integrity: Goal: Risk for impaired skin integrity will decrease Outcome: Progressing
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
## 2019-11-08 LAB — BASIC METABOLIC PANEL
Anion gap: 6 (ref 5–15)
BUN: 18 mg/dL (ref 6–20)
CO2: 24 mmol/L (ref 22–32)
Calcium: 8.8 mg/dL — ABNORMAL LOW (ref 8.9–10.3)
Chloride: 107 mmol/L (ref 98–111)
Creatinine, Ser: 1.23 mg/dL (ref 0.61–1.24)
GFR calc Af Amer: >60 mL/min (ref >60)
GFR calc non Af Amer: >60 mL/min (ref >60)
Glucose, Bld: 178 mg/dL — ABNORMAL HIGH (ref 70–99)
Potassium: 4.3 mmol/L (ref 3.5–5.1)
Sodium: 137 mmol/L (ref 135–145)

## 2019-11-08 LAB — PHOSPHORUS: Phosphorus: 2.8 mg/dL (ref 2.5–4.6)

## 2019-11-08 LAB — MAGNESIUM: Magnesium: 1.9 mg/dL (ref 1.7–2.4)

## 2019-11-08 MED ORDER — DEXTROSE 10 % IV SOLN: INTRAVENOUS | Status: AC

## 2019-11-08 MED ORDER — TRAVASOL 10 % IV SOLN
INTRAVENOUS | Status: DC
  Filled 2019-11-08: qty 720
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
## 2019-11-08 NOTE — Plan of Care (Signed)
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
## 2019-11-09 LAB — DIFFERENTIAL
Abs Immature Granulocytes: 0.03 10*3/uL (ref 0.00–0.07)
Basophils Absolute: 0 10*3/uL (ref 0.0–0.1)
Basophils Relative: 0 %
Eosinophils Absolute: 0.4 10*3/uL (ref 0.0–0.5)
Eosinophils Relative: 5 %
Immature Granulocytes: 0 %
Lymphocytes Relative: 17 %
Lymphs Abs: 1.3 10*3/uL (ref 0.7–4.0)
Monocytes Absolute: 0.6 10*3/uL (ref 0.1–1.0)
Monocytes Relative: 8 %
Neutro Abs: 5.4 10*3/uL (ref 1.7–7.7)
Neutrophils Relative %: 70 %

## 2019-11-09 LAB — COMPREHENSIVE METABOLIC PANEL
ALT: 13 U/L (ref 0–44)
AST: 17 U/L (ref 15–41)
Albumin: 2.4 g/dL — ABNORMAL LOW (ref 3.5–5.0)
Alkaline Phosphatase: 107 U/L (ref 38–126)
Anion gap: 7 (ref 5–15)
BUN: 16 mg/dL (ref 6–20)
CO2: 24 mmol/L (ref 22–32)
Calcium: 8.4 mg/dL — ABNORMAL LOW (ref 8.9–10.3)
Chloride: 107 mmol/L (ref 98–111)
Creatinine, Ser: 1.49 mg/dL — ABNORMAL HIGH (ref 0.61–1.24)
GFR calc Af Amer: >60 mL/min (ref >60)
GFR calc non Af Amer: >60 mL/min (ref >60)
Glucose, Bld: 118 mg/dL — ABNORMAL HIGH (ref 70–99)
Potassium: 3.5 mmol/L (ref 3.5–5.1)
Sodium: 138 mmol/L (ref 135–145)
Total Bilirubin: 0.6 mg/dL (ref 0.3–1.2)
Total Protein: 7 g/dL (ref 6.5–8.1)

## 2019-11-09 LAB — PREALBUMIN: Prealbumin: 19.6 mg/dL (ref 18–38)

## 2019-11-09 LAB — PHOSPHORUS: Phosphorus: 4.5 mg/dL (ref 2.5–4.6)

## 2019-11-09 LAB — CBC
HCT: 29.6 % — ABNORMAL LOW (ref 39.0–52.0)
Hemoglobin: 8.9 g/dL — ABNORMAL LOW (ref 13.0–17.0)
MCH: 26.8 pg (ref 26.0–34.0)
MCHC: 30.1 g/dL (ref 30.0–36.0)
MCV: 89.2 fL (ref 80.0–100.0)
Platelets: 299 10*3/uL (ref 150–400)
RBC: 3.32 MIL/uL — ABNORMAL LOW (ref 4.22–5.81)
RDW: 18.6 % — ABNORMAL HIGH (ref 11.5–15.5)
WBC: 7.8 10*3/uL (ref 4.0–10.5)
nRBC: 0 % (ref 0.0–0.2)

## 2019-11-09 LAB — MAGNESIUM: Magnesium: 1.8 mg/dL (ref 1.7–2.4)

## 2019-11-09 LAB — TRIGLYCERIDES: Triglycerides: 49 mg/dL

## 2019-11-09 MED ORDER — OXYCODONE HCL 5 MG PO TABS
5.0000 mg | ORAL_TABLET | ORAL | Status: DC | PRN
  Administered 2019-11-09 (×3): 5 mg via ORAL
  Filled 2019-11-09 (×3): qty 1

## 2019-11-09 MED ORDER — OXYCODONE HCL 5 MG PO TABS
7.5000 mg | ORAL_TABLET | ORAL | Status: DC | PRN
  Administered 2019-11-09 – 2019-11-12 (×12): 7.5 mg via ORAL
  Filled 2019-11-09 (×12): qty 2
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
## 2019-11-09 NOTE — TOC Progression Note (Addendum)
Transition of Care [facility]) - Progression Note    Patient Details  Name: Brandon Moyer MRN: [id number] Date of Birth: 05-04-1990  Transition of [facility] CM/SW Contact  Nadene Rubins Adria Devon, RN Phone Number: 11/09/2019, 12:00 PM  Clinical Narrative:      See prior note. Patient from home with father and step mother and brothers.   Possible discharge later this week without TPN.   Patient aware. Patient states his PCP is at [facility] on [address]. NCM has called [facility] [phone] and left message to schedule follow up appointment. Awaiting call back.   Patient uses [facility] in [place].   Patient just [facility] 07/31/19 to 08/10/19 , therefore, he is not eligible at this time.       Expected Discharge Plan and Services     Discharge Planning Services: CM Consult   Living arrangements for the past 2 months: Single Family Home                                       Social Determinants of Health (SDOH) Interventions    Readmission Risk Interventions No flowsheet data found.

## 2019-11-09 NOTE — Discharge Instructions (Signed)
Ileostomy, Care After This sheet gives you information about how to care for yourself after your procedure. Your health care provider may also give you more specific instructions. If you have problems or questions, contact your health care provider. What can I expect after the procedure? After the procedure, it is common to have:  A small amount of blood or clear fluid leaking from your stoma.  Pain and discomfort in your abdomen, especially around your stoma.  Irregular bowel movements for several days.  Loose stool. Follow these instructions at home: Medicines  Take over-the-counter and prescription medicines only as told by your health care provider.  If you were prescribed an antibiotic medicine, take it as told by your health care provider. Do not stop taking the antibiotic even if you start to feel better. Stoma and incision care   Keep the skin that surrounds your stoma clean and dry.  Follow instructions from your health care provider about how to take care of your incision. Make sure you: ? Wash your hands with soap and water before and after you change your bandage (dressing). If soap and water are not available, use hand sanitizer. ? Change your dressing as told by your health care provider. ? Leave stitches (sutures), skin glue, or adhesive strips in place. These skin closures may need to stay in place for 2 weeks or longer. If adhesive strip edges start to loosen and curl up, you may trim the loose edges. Do not remove adhesive strips completely unless your health care provider tells you to do that.  Check your stoma area every day for signs of infection. Check for: ? More redness, swelling, or pain. ? More fluid or blood. ? Warmth. ? Pus or a bad smell.  Follow your health care provider's instructions about changing and cleaning your ostomy pouch.  Keep supplies with you at all times to care for your stoma and ostomy pouch. Also keep extra clothing with you. Eating  and drinking   Follow instructions from your health care provider about eating or drinking restrictions.  Pay attention to which foods and drinks cause problems with digestion, such as gas, constipation, or diarrhea.  Avoid spicy foods and caffeine while your stoma heals.  Eat meals and snacks at regular intervals.  Drink enough fluid to keep your urine pale yellow. Activity   Return to your normal activities as told by your health care provider. Ask your health care provider what activities are safe for you.  Rest as much as possible while your stoma heals.  Avoid intense physical activity for as long as you are told by your health care provider.  Do not lift anything that is heavier than 10 lb (4.5 kg), or the limit that you are told, for 6 weeks or until your health care provider says that it is safe. General instructions  Do not drive or use heavy machinery while taking prescription pain medicine.  Wear compression stockings as told by your health care provider. These stockings help to prevent blood clots and reduce swelling in your legs.  Do not take baths, swim, or use a hot tub until your health care provider approves. Ask your health care provider if you may take showers.  Do not use any products that contain nicotine or tobacco, such as cigarettes, e-cigarettes, and chewing tobacco. These can delay incision healing after surgery. If you need help quitting, ask your health care provider.  (Women) Talk with your health care provider if you plan to become   pregnant or if you take birth control pills.  Keep all follow-up visits as told by your health care provider. This is important. Contact a health care provider if:  You have more redness, swelling, or pain at or around your stoma.  You have more fluid or blood coming from your stoma.  Your stoma feels warm to the touch.  You have pus or a bad smell coming from your stoma.  You have a fever.  You have loose stools  that do not become thicker after several weeks.  You have bowel movements more often or less often than your health care provider tells you to expect.  You feel nauseous.  You vomit.  You have abdominal pain, bloating, pressure, or cramping.  You have problems with sexual activity.  You have an unusual lack of energy (fatigue).  You are unusually thirsty or you always have a dry mouth. Get help right away if:  You feel dizzy or light-headed.  You have pain or cramps in your abdomen that get worse or do not go away with medicine.  Your stoma suddenly changes size or color.  You have shortness of breath.  You have bleeding from your stoma that does not stop.  You vomit more than one time.  You faint.  You have internal tissue coming out of your stoma (prolapse).  You have an irregular heartbeat.  You have chest pain. Summary  Take over-the-counter and prescription medicines only as told by your health care provider.  Follow your health care provider's instructions about how to take care of your incision and stoma.  Follow instructions from your health care provider about eating or drinking restrictions.  Do not take baths, swim, or use a hot tub until your health care provider approves. Ask your health care provider if you may take showers.  Contact a health care provider if you have more redness, swelling, or pain at or around your stoma. This information is not intended to replace advice given to you by your health care provider. Make sure you discuss any questions you have with your health care provider. Document Revised: 12/02/2017 Document Reviewed: 12/02/2017 Elsevier Patient Education  2020 Elsevier Inc.  

## 2019-11-10 LAB — BASIC METABOLIC PANEL
Anion gap: 6 (ref 5–15)
BUN: 10 mg/dL (ref 6–20)
CO2: 24 mmol/L (ref 22–32)
Calcium: 8.3 mg/dL — ABNORMAL LOW (ref 8.9–10.3)
Chloride: 106 mmol/L (ref 98–111)
Creatinine, Ser: 1.37 mg/dL — ABNORMAL HIGH (ref 0.61–1.24)
GFR calc Af Amer: >60 mL/min (ref >60)
GFR calc non Af Amer: >60 mL/min (ref >60)
Glucose, Bld: 100 mg/dL — ABNORMAL HIGH (ref 70–99)
Potassium: 3.5 mmol/L (ref 3.5–5.1)
Sodium: 136 mmol/L (ref 135–145)

## 2019-11-10 NOTE — Progress Notes (Signed)
Noted TPN has been discontinued and pt is transitioning to PO pain meds. Please consider PICC removal if he remains off IV nutrition/ meds.
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
## 2019-11-10 NOTE — Plan of Care (Signed)
  Problem: Education: Goal: Knowledge of General Education information will improve Description Including pain rating scale, medication(s)/side effects and non-pharmacologic comfort measures Outcome: Progressing   
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
## 2019-11-10 NOTE — Social Work (Signed)
Hospital follow up appointment made for pt at [facility] where he is a pt. He is scheduled to see Carmel Sacramento, PA on August 10th at 5:45pm. This has been added to AVS along with instructions.  [name], MSW, LCSW [facility] Clinical Social Work

## 2019-11-11 NOTE — Progress Notes (Signed)
VAST RN spoke with pt's nurse Marchelle Folks regarding PICC removal order. Pt requested PICC be left in place until discharge on Friday, 8/6. Marchelle Folks, RN stated she spoke with practitioner who stated to leave line in place until discharge. Marchelle Folks stated she would adjust order to reflect conversation as soon as able.
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
## 2019-11-11 NOTE — Progress Notes (Signed)
Patient declined wound care stating her had addressed himself during the day.

## 2019-11-11 NOTE — Progress Notes (Signed)
1583: Pt. Refused to have PICC d/ced. Notified RN Marchelle Folks and she said she would notify the pt's MD.

## 2019-11-12 MED ORDER — CLONAZEPAM 0.5 MG PO TABS: 0.5000 mg | ORAL_TABLET | Freq: Two times a day (BID) | ORAL | 0 refills | Status: AC | PRN

## 2019-11-12 MED ORDER — ACETAMINOPHEN 325 MG PO TABS: 650.0000 mg | ORAL_TABLET | Freq: Four times a day (QID) | ORAL | Status: AC | PRN

## 2019-11-12 MED ORDER — PSYLLIUM 95 % PO PACK: 1.0000 | PACK | Freq: Every day | ORAL | 0 refills | Status: AC

## 2019-11-12 MED ORDER — HYDROMORPHONE HCL 2 MG PO TABS
2.0000 mg | ORAL_TABLET | Freq: Four times a day (QID) | ORAL | Status: DC | PRN
  Administered 2019-11-12 – 2019-11-13 (×4): 2 mg via ORAL
  Filled 2019-11-12 (×4): qty 1

## 2019-11-12 MED ORDER — METHOCARBAMOL 750 MG PO TABS: 750.0000 mg | ORAL_TABLET | Freq: Three times a day (TID) | ORAL | 0 refills | Status: AC

## 2019-11-12 MED ORDER — GABAPENTIN 300 MG PO CAPS: 300.0000 mg | ORAL_CAPSULE | Freq: Three times a day (TID) | ORAL | 0 refills | Status: AC

## 2019-11-12 MED ORDER — HYDROMORPHONE HCL 2 MG PO TABS: 2.0000 mg | ORAL_TABLET | Freq: Four times a day (QID) | ORAL | 0 refills | Status: DC | PRN

## 2019-11-12 MED ORDER — METHOCARBAMOL 500 MG PO TABS: 500.0000 mg | ORAL_TABLET | Freq: Four times a day (QID) | ORAL | 0 refills | Status: AC | PRN

## 2019-11-12 MED ORDER — OXYCODONE HCL 5 MG PO TABS: 10.0000 mg | ORAL_TABLET | ORAL | Status: DC | PRN

## 2019-11-12 MED FILL — GABAPENTIN 300 MG CAPSULE: 300 | 30 days supply | Qty: 90 | Fill #0

## 2019-11-12 MED FILL — METAMUCIL FIBER SINGLES PAC: 58.12 | 30 days supply | Qty: 30 | Fill #0

## 2019-11-12 MED FILL — METHOCARBAMOL 750 MG TABS: 750 | 30 days supply | Qty: 90 | Fill #0

## 2019-11-12 MED FILL — clonazePAM 0.5 MG TABS: 0.5 | 5 days supply | Qty: 10 | Fill #0
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
## 2019-11-12 NOTE — Social Work (Signed)
CSW made additional appointment for pt at [facility] on October 21st at 8:50am if pt unable to pay copay/make appointment at [facility]. This has been shared with care team.   [name], MSW, LCSW [place] [facility] Social Work

## 2019-11-13 MED ORDER — ADULT MULTIVITAMIN W/MINERALS CH: 1.0000 | ORAL_TABLET | Freq: Every day | ORAL | Status: AC

## 2019-11-13 MED ORDER — ADULT MULTIVITAMIN W/MINERALS CH
1.0000 | ORAL_TABLET | Freq: Every day | ORAL | Status: DC
  Administered 2019-11-13: 1 via ORAL
  Filled 2019-11-13: qty 1

## 2019-11-13 MED ORDER — HYDROMORPHONE HCL 2 MG PO TABS
2.0000 mg | ORAL_TABLET | ORAL | Status: DC | PRN
  Administered 2019-11-13: 2 mg via ORAL
  Filled 2019-11-13: qty 1

## 2019-11-13 MED ORDER — HYDROMORPHONE HCL 2 MG PO TABS: 2.0000 mg | ORAL_TABLET | ORAL | 0 refills | Status: AC | PRN

## 2019-11-13 MED FILL — HYDROmorphone HCL 2 MG TABS: 2 | 5 days supply | Qty: 30 | Fill #0

## 2019-11-13 NOTE — Progress Notes (Addendum)
Awab Marvel Plan to be D/C'd  per MD order. Discussed with the patient and all questions fully answered.   Patient verbalizes he dont need anything, asked if he needs additional teaching with his drain, patient responded "he can manage, he dont need anything. Offered supplies to be checked so I can give him any if needed, patient stated " he is oK'. Pharmacy made aware that brother in the room for meds, awaiting staff at this time . SW stated patient is good to go if meds from [facility] pharmacy is delivered.  IV catheter discontinued by IV team intact. Site without signs and symptoms of complications. Dressing and pressure applied.  An After Visit Summary was printed and given to the patient. Patient received prescription.  D/c education completed with patient/family including follow up instructions, medication list, d/c activities limitations if indicated, with other d/c instructions as indicated by MD - patient able to verbalize understanding, all questions fully answered.   Patient instructed to return to ED, call 911, or call MD for any changes in condition.   Patient to be escorted via WC, and D/C home via private auto.
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
## 2019-11-13 NOTE — Plan of Care (Signed)

## 2019-11-16 ENCOUNTER — Other Ambulatory Visit: Payer: Self-pay | Admitting: Surgery

## 2019-11-16 DIAGNOSIS — K632 Fistula of intestine: Secondary | ICD-10-CM

## 2019-12-01 ENCOUNTER — Other Ambulatory Visit: Payer: Self-pay

## 2019-12-08 ENCOUNTER — Encounter: Payer: Self-pay | Admitting: Radiology

## 2019-12-08 ENCOUNTER — Ambulatory Visit
Admission: RE | Admit: 2019-12-08 | Discharge: 2019-12-08 | Disposition: A | Discharge status: Home / Self Care | Payer: Self-pay | Source: Ambulatory Visit | Attending: Radiology | Admitting: Radiology

## 2019-12-08 ENCOUNTER — Ambulatory Visit
Admission: RE | Admit: 2019-12-08 | Discharge: 2019-12-08 | Disposition: A | Discharge status: Home / Self Care | Payer: Self-pay | Source: Ambulatory Visit | Attending: Surgery | Admitting: Surgery

## 2019-12-08 DIAGNOSIS — K632 Fistula of intestine: Secondary | ICD-10-CM

## 2019-12-08 HISTORY — PX: IR RADIOLOGIST EVAL & MGMT: IMG5224
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
## 2020-01-28 ENCOUNTER — Ambulatory Visit: Payer: Self-pay | Admitting: Internal Medicine
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
## 2022-03-09 DIAGNOSIS — Z419 Encounter for procedure for purposes other than remedying health state, unspecified: Secondary | ICD-10-CM | POA: Diagnosis not present

## 2022-04-09 DIAGNOSIS — Z419 Encounter for procedure for purposes other than remedying health state, unspecified: Secondary | ICD-10-CM | POA: Diagnosis not present

## 2022-05-10 DIAGNOSIS — Z419 Encounter for procedure for purposes other than remedying health state, unspecified: Secondary | ICD-10-CM | POA: Diagnosis not present

## 2022-06-08 DIAGNOSIS — Z419 Encounter for procedure for purposes other than remedying health state, unspecified: Secondary | ICD-10-CM | POA: Diagnosis not present

## 2022-07-09 DIAGNOSIS — Z419 Encounter for procedure for purposes other than remedying health state, unspecified: Secondary | ICD-10-CM | POA: Diagnosis not present

## 2022-08-08 DIAGNOSIS — Z419 Encounter for procedure for purposes other than remedying health state, unspecified: Secondary | ICD-10-CM | POA: Diagnosis not present

## 2022-09-08 DIAGNOSIS — Z419 Encounter for procedure for purposes other than remedying health state, unspecified: Secondary | ICD-10-CM | POA: Diagnosis not present

## 2022-10-08 DIAGNOSIS — Z419 Encounter for procedure for purposes other than remedying health state, unspecified: Secondary | ICD-10-CM | POA: Diagnosis not present

## 2022-11-08 DIAGNOSIS — Z419 Encounter for procedure for purposes other than remedying health state, unspecified: Secondary | ICD-10-CM | POA: Diagnosis not present

## 2022-12-09 DIAGNOSIS — Z419 Encounter for procedure for purposes other than remedying health state, unspecified: Secondary | ICD-10-CM | POA: Diagnosis not present

## 2023-01-08 DIAGNOSIS — Z419 Encounter for procedure for purposes other than remedying health state, unspecified: Secondary | ICD-10-CM | POA: Diagnosis not present

## 2023-02-08 DIAGNOSIS — Z419 Encounter for procedure for purposes other than remedying health state, unspecified: Secondary | ICD-10-CM | POA: Diagnosis not present

## 2023-03-10 DIAGNOSIS — Z419 Encounter for procedure for purposes other than remedying health state, unspecified: Secondary | ICD-10-CM | POA: Diagnosis not present

## 2023-04-10 DIAGNOSIS — Z419 Encounter for procedure for purposes other than remedying health state, unspecified: Secondary | ICD-10-CM | POA: Diagnosis not present

## 2023-05-11 DIAGNOSIS — Z419 Encounter for procedure for purposes other than remedying health state, unspecified: Secondary | ICD-10-CM | POA: Diagnosis not present

## 2023-06-08 DIAGNOSIS — Z419 Encounter for procedure for purposes other than remedying health state, unspecified: Secondary | ICD-10-CM | POA: Diagnosis not present

## 2023-07-20 DIAGNOSIS — Z419 Encounter for procedure for purposes other than remedying health state, unspecified: Secondary | ICD-10-CM | POA: Diagnosis not present

## 2023-08-19 DIAGNOSIS — Z419 Encounter for procedure for purposes other than remedying health state, unspecified: Secondary | ICD-10-CM | POA: Diagnosis not present

## 2023-09-19 DIAGNOSIS — Z419 Encounter for procedure for purposes other than remedying health state, unspecified: Secondary | ICD-10-CM | POA: Diagnosis not present

## 2023-10-19 DIAGNOSIS — Z419 Encounter for procedure for purposes other than remedying health state, unspecified: Secondary | ICD-10-CM | POA: Diagnosis not present

## 2023-11-19 DIAGNOSIS — Z419 Encounter for procedure for purposes other than remedying health state, unspecified: Secondary | ICD-10-CM | POA: Diagnosis not present

## 2023-12-20 DIAGNOSIS — Z419 Encounter for procedure for purposes other than remedying health state, unspecified: Secondary | ICD-10-CM | POA: Diagnosis not present
